# Patient Record
Sex: Female | Born: 2000
Health system: Southern US, Community
[De-identification: ages and names within clinical notes are randomized; demographics above are authoritative.]

## PROBLEM LIST (undated history)

## (undated) DIAGNOSIS — R51 Headache: Principal | ICD-10-CM

## (undated) DIAGNOSIS — R519 Headache, unspecified: Secondary | ICD-10-CM

## (undated) HISTORY — PX: COSMETIC SURGERY: SHX468

## (undated) HISTORY — DX: Headache, unspecified: R51.9

## (undated) HISTORY — DX: Headache: R51

---
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
## 2018-01-04 ENCOUNTER — Encounter: Payer: 59 | Admitting: Physical Therapy

## 2018-01-11 ENCOUNTER — Ambulatory Visit: Payer: 59 | Attending: Internal Medicine | Admitting: Physical Therapy

## 2018-01-19 ENCOUNTER — Encounter: Payer: Self-pay | Admitting: Internal Medicine

## 2018-01-19 ENCOUNTER — Ambulatory Visit: Payer: 59 | Admitting: Internal Medicine

## 2018-01-19 VITALS — BP 110/76 | HR 106 | Temp 98.3°F | Wt 134.0 lb

## 2018-01-19 DIAGNOSIS — R0602 Shortness of breath: Secondary | ICD-10-CM | POA: Diagnosis not present

## 2018-01-19 DIAGNOSIS — J45990 Exercise induced bronchospasm: Secondary | ICD-10-CM | POA: Diagnosis not present

## 2018-01-19 DIAGNOSIS — R0789 Other chest pain: Secondary | ICD-10-CM

## 2018-01-19 MED ORDER — ALBUTEROL SULFATE HFA 108 (90 BASE) MCG/ACT IN AERS: 2.0000 | INHALATION_SPRAY | Freq: Four times a day (QID) | RESPIRATORY_TRACT | 0 refills | Status: DC | PRN

## 2018-01-25 ENCOUNTER — Telehealth: Payer: Self-pay

## 2018-01-25 NOTE — Telephone Encounter (Signed)
Copied from CRM [phone]#[id number]. Topic: Inquiry >> Jan 25, 2018  4:05 PM Debroah LoopLander, Lumin L wrote: Reason for CRM: Would like first time ever HPC vaccine. Please call back to schedule as I'm not sure if it's on hand.

## 2018-01-26 ENCOUNTER — Encounter: Payer: Self-pay | Admitting: Internal Medicine

## 2018-01-26 NOTE — Telephone Encounter (Signed)
I spoke with pts mom (DPR signed) and pt needed appt after 12:30; pt scheduled on 02/01/18 at 2:30 for nurse visit.pts mom voiced understanding.

## 2018-01-26 NOTE — Patient Instructions (Signed)

## 2018-01-26 NOTE — Progress Notes (Signed)
Subjective:    Patient ID: Emma Gaines, female    DOB: Oct 28, 2001, 17 y.o.   MRN: [id number]  HPI  Pt presents to the clinic today with c/o intermittent chest tightness and shortness of breath with exertion. She noticed this about 6 months ago. She reports she exercises a lot, plays soccor, but sometimes if it is extremely hot or cold, she will have the chest tightness and shortness of breath. If she rests for 10-15 minutes, her symptoms resolve. She denies chest pain. She has tried a friend's Albuterol inhaler with good relief. She has never been told that she has asthma or reactive airway disease.   Review of Systems      Past Medical History:  Diagnosis Date  . Frequent headaches     Current Outpatient Medications  Medication Sig Dispense Refill  . buPROPion (WELLBUTRIN XL) 150 MG 24 hr tablet TAKE 1 TABLET BY MOUTH ONCE DAILY 30 tablet 2  . butalbital-acetaminophen-caffeine (FIORICET, ESGIC) 50-325-40 MG tablet Take 1-2 tablets by mouth every 6 (six) hours as needed for headache. 20 tablet 0  . hydrOXYzine (ATARAX/VISTARIL) 10 MG tablet Take 1 tablet (10 mg total) by mouth daily as needed. 30 tablet 0  . naproxen (NAPROSYN) 500 MG tablet Take 1 tablet (500 mg total) by mouth 2 (two) times daily with a meal. 60 tablet 2  . norgestimate-ethinyl estradiol (SPRINTEC 28) 0.25-35 MG-MCG tablet Take 1 tablet by mouth daily. 28 tablet 11  . ondansetron (ZOFRAN) 4 MG tablet Take 1 tablet (4 mg total) by mouth every 8 (eight) hours as needed. 20 tablet 0  . albuterol (PROVENTIL HFA;VENTOLIN HFA) 108 (90 Base) MCG/ACT inhaler Inhale 2 puffs into the lungs every 6 (six) hours as needed for wheezing or shortness of breath. 1 Inhaler 0   No current facility-administered medications for this visit.     Allergies  Allergen Reactions  . Other Anaphylaxis    CORN DOG    Family History  Problem Relation Age of Onset  . Lung cancer Maternal Grandmother   . Alcohol abuse Maternal  Grandfather   . Colon cancer Paternal Uncle 24    Social History   Socioeconomic History  . Marital status: Single    Spouse name: Not on file  . Number of children: Not on file  . Years of education: Not on file  . Highest education level: Not on file  Social Needs  . Financial resource strain: Not on file  . Food insecurity - worry: Not on file  . Food insecurity - inability: Not on file  . Transportation needs - medical: Not on file  . Transportation needs - non-medical: Not on file  Occupational History  . Not on file  Tobacco Use  . Smoking status: Never Smoker  . Smokeless tobacco: Never Used  Substance and Sexual Activity  . Alcohol use: No    Alcohol/week: 0.0 oz  . Drug use: No  . Sexual activity: Yes  Other Topics Concern  . Not on file  Social History Narrative  . Not on file     Constitutional: Denies fever, malaise, fatigue, headache or abrupt weight changes.  HEENT: Denies eye pain, eye redness, ear pain, ringing in the ears, wax buildup, runny nose, nasal congestion, bloody nose, or sore throat. Respiratory: Pt reports intermittent shortness of breath. Denies difficulty breathing, cough or sputum production.   Cardiovascular: Pt reports intermittent chest tightness. Denies chest pain, palpitations or swelling in the hands or feet.  No other specific complaints in a complete review of systems (except as listed in HPI above).  Objective:   Physical Exam   BP 110/76   Pulse (!) 106   Temp 98.3 F (36.8 C) (Oral)   Wt 134 lb (60.8 kg)   LMP 01/09/2018   SpO2 98%  Wt Readings from Last 3 Encounters:  01/19/18 134 lb (60.8 kg) (67 %, Z= 0.43)*  12/25/17 133 lb 1.9 oz (60.4 kg) (66 %, Z= 0.40)*  09/21/17 132 lb (59.9 kg) (65 %, Z= 0.38)*   * Growth percentiles are based on CDC (Girls, 2-20 Years) data.    General: Appears her stated age, well developed, well nourished in NAD. Cardiovascular: Normal rate and rhythm. S1,S2 noted.  No murmur, rubs  or gallops noted.  Pulmonary/Chest: Normal effort and positive vesicular breath sounds. No respiratory distress. No wheezes, rales or ronchi noted.         Assessment & Plan:   Exercise Induced Asthma:  eRx for Albuterol 30 minutes prior to exercise RX for Peak Flow Meter per mothers request  RTC as needed or if symptoms persist or worsen Yamira Papa, NP

## 2018-01-26 NOTE — Telephone Encounter (Signed)
Ok to schedule nurse visit for HPV vaccine

## 2018-02-01 ENCOUNTER — Ambulatory Visit (INDEPENDENT_AMBULATORY_CARE_PROVIDER_SITE_OTHER): Payer: 59

## 2018-02-01 DIAGNOSIS — Z23 Encounter for immunization: Secondary | ICD-10-CM | POA: Diagnosis not present

## 2018-04-03 ENCOUNTER — Other Ambulatory Visit: Payer: Self-pay

## 2018-04-03 ENCOUNTER — Ambulatory Visit (INDEPENDENT_AMBULATORY_CARE_PROVIDER_SITE_OTHER): Payer: 59

## 2018-04-03 ENCOUNTER — Encounter: Payer: Self-pay | Admitting: Emergency Medicine

## 2018-04-03 ENCOUNTER — Ambulatory Visit
Admission: EM | Admit: 2018-04-03 | Discharge: 2018-04-03 | Disposition: A | Discharge status: Home / Self Care | Payer: 59 | Attending: Family Medicine | Admitting: Family Medicine

## 2018-04-03 DIAGNOSIS — Y9366 Activity, soccer: Secondary | ICD-10-CM

## 2018-04-03 DIAGNOSIS — M79672 Pain in left foot: Secondary | ICD-10-CM

## 2018-04-03 MED ORDER — MELOXICAM 15 MG PO TABS: 15.0000 mg | ORAL_TABLET | Freq: Every day | ORAL | 0 refills | Status: DC | PRN

## 2018-04-03 NOTE — Discharge Instructions (Signed)
Rest, Ice, Elevation.  Medication as directed.  Take care  Dr. Adriana Simas

## 2018-04-03 NOTE — ED Provider Notes (Signed)
[facility]  CSN: [id number] Arrival date & time: 04/03/18  1926  History   Chief Complaint Chief Complaint  Patient presents with  . Foot Pain    left   HPI  17 year old female presents with a foot injury.  Patient reports that she was at soccer practice and was either stepped on or kicked in the left foot.  She reports significant pain to the dorsum of the left foot.  No reports of swelling.  No ankle pain.  No ankle swelling.  She reports increase in pain with plantar flexion.  No medications tried.  No other associated symptoms.  No other complaints.  Past Medical History:  Diagnosis Date  . Frequent headaches    Patient Active Problem List   Diagnosis Date Noted  . Anxiety 09/21/2017  . Frequent headaches 05/19/2015   Past Surgical History:  Procedure Laterality Date  . COSMETIC SURGERY     upper lip   OB History   None    Home Medications    Prior to Admission medications   Medication Sig Start Date End Date Taking? Authorizing Provider  buPROPion (WELLBUTRIN XL) 150 MG 24 hr tablet TAKE 1 TABLET BY MOUTH ONCE DAILY 12/15/17  Yes Baity, Salvadore Oxford, NP  norgestimate-ethinyl estradiol (SPRINTEC 28) 0.25-35 MG-MCG tablet Take 1 tablet by mouth daily. 09/21/17  Yes Baity, Salvadore Oxford, NP  albuterol (PROVENTIL HFA;VENTOLIN HFA) 108 (90 Base) MCG/ACT inhaler Inhale 2 puffs into the lungs every 6 (six) hours as needed for wheezing or shortness of breath. 01/19/18   Lorre Munroe, NP  butalbital-acetaminophen-caffeine (FIORICET, ESGIC) [phone] MG tablet Take 1-2 tablets by mouth every 6 (six) hours as needed for headache. 12/25/17 12/25/18  Lorre Munroe, NP  meloxicam (MOBIC) 15 MG tablet Take 1 tablet (15 mg total) by mouth daily as needed. 04/03/18   Tommie Sams, DO  ondansetron (ZOFRAN) 4 MG tablet Take 1 tablet (4 mg total) by mouth every 8 (eight) hours as needed. 12/25/17   Lorre Munroe, NP   Family History Family History  Problem Relation Age of Onset   . Lung cancer Maternal Grandmother   . Alcohol abuse Maternal Grandfather   . Colon cancer Paternal Uncle 53   Social History Social History   Tobacco Use  . Smoking status: Never Smoker  . Smokeless tobacco: Never Used  Substance Use Topics  . Alcohol use: No    Alcohol/week: 0.0 oz  . Drug use: No   Allergies   Other  Review of Systems Review of Systems  Constitutional: Negative.   Musculoskeletal:       Left foot pain.   Physical Exam Triage Vital Signs ED Triage Vitals  Enc Vitals Group     BP 04/03/18 1938 113/76     Pulse Rate 04/03/18 1938 78     Resp 04/03/18 1938 14     Temp 04/03/18 1938 98.3 F (36.8 C)     Temp Source 04/03/18 1938 Oral     SpO2 04/03/18 1938 100 %     Weight 04/03/18 1936 130 lb (59 kg)     Height --      Head Circumference --      Peak Flow --      Pain Score 04/03/18 1936 6     Pain Loc --      Pain Edu? --      Excl. in GC? --    Updated Vital Signs BP 113/76 (BP Location: Right Arm)  Pulse 78   Temp 98.3 F (36.8 C) (Oral)   Resp 14   Wt 130 lb (59 kg)   LMP 04/03/2018 (Exact Date)   SpO2 100%   Physical Exam  Constitutional: She is oriented to person, place, and time. She appears well-developed. No distress.  HENT:  Head: Normocephalic and atraumatic.  Pulmonary/Chest: Effort normal. No respiratory distress.  Musculoskeletal:  Left foot: Tenderness on the dorsum of the foot.  No bruising or swelling.  Left ankle: No swelling.  No tenderness.  Neurological: She is alert and oriented to person, place, and time.  Psychiatric: She has a normal mood and affect. Her behavior is normal.  Nursing note and vitals reviewed.  UC Treatments / Results  Labs (all labs ordered are listed, but only abnormal results are displayed) Labs Reviewed - No data to display  EKG None  Radiology Dg Foot Complete Left  Result Date: 04/03/2018 CLINICAL DATA:  Soccer injury with pain EXAM: LEFT FOOT - COMPLETE 3+ VIEW COMPARISON:   10/23/2015 FINDINGS: There is no evidence of fracture or dislocation. There is no evidence of arthropathy or other focal bone abnormality. Soft tissues are unremarkable. Ossicle adjacent to the navicular bone. IMPRESSION: Negative. Electronically Signed   By: Jasmine Pang M.D.   On: 04/03/2018 20:15    Procedures Procedures (including critical care time)  Medications Ordered in UC Medications - No data to display  Initial Impression / Assessment and Plan / UC Course  I have reviewed the triage vital signs and the nursing notes.  Pertinent labs & imaging results that were available during my care of the patient were reviewed by me and considered in my medical decision making (see chart for details).    17 year old female presents with a foot injury.  X-ray negative.  Treating with meloxicam.  Supportive care.  Final Clinical Impressions(s) / UC Diagnoses   Final diagnoses:  Left foot pain   ED Prescriptions    Medication Sig Dispense Auth. Provider   meloxicam (MOBIC) 15 MG tablet Take 1 tablet (15 mg total) by mouth daily as needed. 30 tablet Tommie Sams, DO     Controlled Substance Prescriptions [place] Controlled Substance Registry consulted? Not Applicable   Tommie Sams, DO 04/03/18 2046

## 2018-04-03 NOTE — ED Triage Notes (Signed)
Patient states that another player stepped on the top of her left foot today at soccer practice.

## 2018-04-05 ENCOUNTER — Ambulatory Visit: Payer: 59

## 2018-04-11 ENCOUNTER — Ambulatory Visit (INDEPENDENT_AMBULATORY_CARE_PROVIDER_SITE_OTHER): Payer: 59

## 2018-04-11 DIAGNOSIS — Z23 Encounter for immunization: Secondary | ICD-10-CM | POA: Diagnosis not present

## 2018-06-04 ENCOUNTER — Ambulatory Visit: Payer: 59 | Admitting: Family Medicine

## 2018-06-04 ENCOUNTER — Encounter: Payer: Self-pay | Admitting: Family Medicine

## 2018-06-04 VITALS — BP 102/64 | HR 97 | Temp 98.1°F | Ht 64.05 in | Wt 130.0 lb

## 2018-06-04 DIAGNOSIS — B9789 Other viral agents as the cause of diseases classified elsewhere: Secondary | ICD-10-CM | POA: Diagnosis not present

## 2018-06-04 DIAGNOSIS — J069 Acute upper respiratory infection, unspecified: Secondary | ICD-10-CM

## 2018-06-04 MED ORDER — AMOXICILLIN 875 MG PO TABS: 875.0000 mg | ORAL_TABLET | Freq: Two times a day (BID) | ORAL | 0 refills | Status: DC

## 2018-06-04 NOTE — Patient Instructions (Signed)
Good to see you today  If you are not better in 3-4 days, you can start antibiotic  For nasal congestion you can use Afrin nasal spray for 3 days max, Sudafed, saline nasal spray (generic is fine for all). For cough you can try Delsym. Drink enough fluids to make your urine light yellow. For fever/chill/muscle aches you can take over the counter acetaminophen or ibuprofen.  Please come back in if you are not better in 5-7 days or if you develop wheezing, shortness of breath or persistent vomiting.

## 2018-06-04 NOTE — Progress Notes (Signed)
Subjective:    Patient ID: Emma Gaines, female    DOB: Nov 07, 2001, 17 y.o.   MRN: [id number]  HPI This is an 17 yo female, accompanied by her mother who is also being seen for similar symptoms, who presents today with 8 days of sore throat.  Started with nasal congestion, sore throat, left ear pain. Alternating congestion, runny nose. Cough which is dry. No wheezing, no SOB. Has taken ibuprofen, sudafed, Afrin. Some improvement of symptoms.    Past Medical History:  Diagnosis Date  . Frequent headaches    Past Surgical History:  Procedure Laterality Date  . COSMETIC SURGERY     upper lip   Family History  Problem Relation Age of Onset  . Lung cancer Maternal Grandmother   . Alcohol abuse Maternal Grandfather   . Colon cancer Paternal Uncle [id number]   Social History   Tobacco Use  . Smoking status: Never Smoker  . Smokeless tobacco: Never Used  Substance Use Topics  . Alcohol use: No    Alcohol/week: 0.0 oz  . Drug use: No      Review of Systems Per HPI    Objective:   Physical Exam  Constitutional: She is oriented to person, place, and time. She appears well-developed and well-nourished.  Non-toxic appearance. She does not appear ill. No distress.  HENT:  Head: Normocephalic and atraumatic.  Right Ear: Ear canal normal. Tympanic membrane is scarred.  Left Ear: Tympanic membrane and ear canal normal.  Mouth/Throat: Uvula is midline and oropharynx is clear and moist. No oropharyngeal exudate, posterior oropharyngeal edema or posterior oropharyngeal erythema.  Neck: Normal range of motion. Neck supple.  Cardiovascular: Normal rate, regular rhythm and normal heart sounds.  Pulmonary/Chest: Effort normal and breath sounds normal.  Neurological: She is alert and oriented to person, place, and time.  Skin: Skin is warm and dry.  Psychiatric: She has a normal mood and affect. Her behavior is normal. Judgment and thought content normal.      BP 102/64 (BP  Location: Right Arm, Patient Position: Sitting, Cuff Size: Normal)   Pulse 97   Temp 98.1 F (36.7 C) (Oral)   Ht 5' 4.05" (1.627 m)   Wt 130 lb (59 kg)   LMP 05/28/2018   SpO2 98%   BMI 22.28 kg/m  Wt Readings from Last 3 Encounters:  06/04/18 130 lb (59 kg) (59 %, Z= 0.22)*  04/03/18 130 lb (59 kg) (59 %, Z= 0.24)*  01/19/18 134 lb (60.8 kg) (67 %, Z= 0.43)*   * Growth percentiles are based on CDC (Girls, 2-20 Years) data.       Assessment & Plan:  1. Viral URI with cough - Provided written and verbal information regarding diagnosis and treatment. -  Patient Instructions  Good to see you today  If you are not better in 3-4 days, you can start antibiotic  For nasal congestion you can use Afrin nasal spray for 3 days max, Sudafed, saline nasal spray (generic is fine for all). For cough you can try Delsym. Drink enough fluids to make your urine light yellow. For fever/chill/muscle aches you can take over the counter acetaminophen or ibuprofen.  Please come back in if you are not better in 5-7 days or if you develop wheezing, shortness of breath or persistent vomiting.    - wait and see antibiotic provided- Amoxicillin 875 mg po BID #14   Olean Reeeborah Eller Sweis, FNP-BC  [facility], [facility]  06/04/2018 2:43 PM

## 2018-07-31 ENCOUNTER — Telehealth: Payer: Self-pay

## 2018-07-31 NOTE — Telephone Encounter (Signed)
She still has to be seen

## 2018-07-31 NOTE — Telephone Encounter (Signed)
I spoke with Emma Gaines (DPR signed); pt in college this morning; offered 2 appts for pt to be seen today. pts mom said pt is taking AZO and does not want to come in for appt. Belinda will ck with pt later today for update and cb if appt needed. Belinda wants R Baity NP to know that on urine ck pt has +leukocytes but negative nitrites.

## 2018-07-31 NOTE — Telephone Encounter (Signed)
PLEASE NOTE: All timestamps contained within this report are represented as [place] Standard Time. CONFIDENTIALTY NOTICE: This fax transmission is intended only for the addressee. It contains information that is legally privileged, confidential or otherwise protected from use or disclosure. If you are not the intended recipient, you are strictly prohibited from reviewing, disclosing, copying using or disseminating any of this information or taking any action in reliance on or regarding this information. If you have received this fax in error, please notify us immediately by telephone so that we can arrange for its return to us. Phone: [phone], Toll-Free: [phone], Fax: [phone] Page: 1 of 1 Call Id: [id number] [facility] - Client Nonclinical Telephone Record [facility] Client [facility] - Client [facility] - Night Physician Nicki ReaperBaity, Regina - NP Contact Type Call Who Is Calling Patient / Member / Family / Caregiver Caller Name Carolee RotaBelenda Membreno Caller Phone Number [phone] Patient Name Emma SearsHayley Gaines Patient DOB 06/25/2001 Call Type Message Only Information Provided Reason for Call Request to Schedule Office Appointment Initial Comment Caller states she wants to make an apt for her dtr. Additional Comment Caller declined triage. Call Closed By: Virgilio FreesLatrice Booker Transaction Date/Time: 07/30/2018 7:36:14 PM (ET)

## 2018-08-02 ENCOUNTER — Encounter: Payer: Self-pay | Admitting: Internal Medicine

## 2018-08-02 ENCOUNTER — Ambulatory Visit: Payer: 59 | Admitting: Internal Medicine

## 2018-08-02 ENCOUNTER — Ambulatory Visit: Payer: 59

## 2018-08-02 VITALS — BP 106/78 | HR 79 | Temp 98.7°F | Wt 134.0 lb

## 2018-08-02 DIAGNOSIS — R310 Gross hematuria: Secondary | ICD-10-CM | POA: Diagnosis not present

## 2018-08-02 DIAGNOSIS — Z23 Encounter for immunization: Secondary | ICD-10-CM | POA: Diagnosis not present

## 2018-08-02 DIAGNOSIS — R300 Dysuria: Secondary | ICD-10-CM

## 2018-08-02 DIAGNOSIS — R350 Frequency of micturition: Secondary | ICD-10-CM

## 2018-08-02 LAB — POC URINALSYSI DIPSTICK (AUTOMATED)
Bilirubin, UA: NEGATIVE
Blood, UA: NEGATIVE
Glucose, UA: NEGATIVE
Ketones, UA: NEGATIVE
Leukocytes, UA: NEGATIVE
Nitrite, UA: NEGATIVE
Protein, UA: NEGATIVE
Spec Grav, UA: 1.015 (ref 1.010–1.025)
Urobilinogen, UA: 0.2 E.U./dL
pH, UA: 7 (ref 5.0–8.0)

## 2018-08-02 NOTE — Progress Notes (Signed)
HPI  Pt presents to the clinic today with c/o urinary frequency, dysuria and blood in her urine. This started 3-4 days ago. She denies fever, chills, nausea or low back pain. She denies vaginal complaints. She has tried AZO with minimal relief. She is sexually active.   Review of Systems  Past Medical History:  Diagnosis Date  . Frequent headaches     Family History  Problem Relation Age of Onset  . Lung cancer Maternal Grandmother   . Alcohol abuse Maternal Grandfather   . Colon cancer Paternal Uncle [id number]    Social History   Socioeconomic History  . Marital status: Single    Spouse name: Not on file  . Number of children: Not on file  . Years of education: Not on file  . Highest education level: Not on file  Occupational History  . Not on file  Social Needs  . Financial resource strain: Not on file  . Food insecurity:    Worry: Not on file    Inability: Not on file  . Transportation needs:    Medical: Not on file    Non-medical: Not on file  Tobacco Use  . Smoking status: Never Smoker  . Smokeless tobacco: Never Used  Substance and Sexual Activity  . Alcohol use: No    Alcohol/week: 0.0 standard drinks  . Drug use: No  . Sexual activity: Yes  Lifestyle  . Physical activity:    Days per week: Not on file    Minutes per session: Not on file  . Stress: Not on file  Relationships  . Social connections:    Talks on phone: Not on file    Gets together: Not on file    Attends religious service: Not on file    Active member of club or organization: Not on file    Attends meetings of clubs or organizations: Not on file    Relationship status: Not on file  . Intimate partner violence:    Fear of current or ex partner: Not on file    Emotionally abused: Not on file    Physically abused: Not on file    Forced sexual activity: Not on file  Other Topics Concern  . Not on file  Social History Narrative  . Not on file    Allergies  Allergen Reactions  . Other  Anaphylaxis    CORN DOG     Constitutional: Denies fever, malaise, fatigue, headache or abrupt weight changes.   GU: Pt reports hematuria, frequency and pain with urination. Denies urgency, burning sensation,  odor or discharge.   No other specific complaints in a complete review of systems (except as listed in HPI above).    Objective:   Physical Exam BP 106/78   Pulse 79   Temp 98.7 F (37.1 C) (Oral)   Wt 134 lb (60.8 kg)   LMP 07/23/2018   SpO2 98%   BMI 22.97 kg/m   Wt Readings from Last 3 Encounters:  08/02/18 134 lb (60.8 kg) (64 %, Z= 0.37)*  06/04/18 130 lb (59 kg) (59 %, Z= 0.22)*  04/03/18 130 lb (59 kg) (59 %, Z= 0.24)*   * Growth percentiles are based on CDC (Girls, 2-20 Years) data.    General: Appears her stated age, well developed, well nourished in NAD. Abdomen: Soft and nontender. Normal bowel sounds. No distention or masses noted. No CVA tenderness.        Assessment & Plan:   Hematuria,  Frequency, Dysuria:  Urinalysis: normal Will send urine culture OK to take AZO OTC Drink plenty of fluids  RTC as needed or if symptoms persist. Nicki Reaper, NP

## 2018-08-02 NOTE — Patient Instructions (Signed)

## 2018-08-02 NOTE — Addendum Note (Signed)
Addended by: Roena MaladyEVONTENNO, MELANIE Y on: 08/02/2018 04:51 PM   Modules accepted: Orders

## 2018-08-03 LAB — URINE CULTURE
MICRO NUMBER:: 91035840
RESULT: NO GROWTH
SPECIMEN QUALITY:: ADEQUATE

## 2018-08-21 ENCOUNTER — Other Ambulatory Visit: Payer: Self-pay | Admitting: Internal Medicine

## 2018-08-22 IMAGING — CR DG FOOT COMPLETE 3+V*L*
3 series · 3 of 3 positions shown · non-contrast
Comparison: 10/23/2015

CLINICAL DATA: Soccer injury with pain

EXAM:
LEFT FOOT - COMPLETE 3+ VIEW

[foot ap]
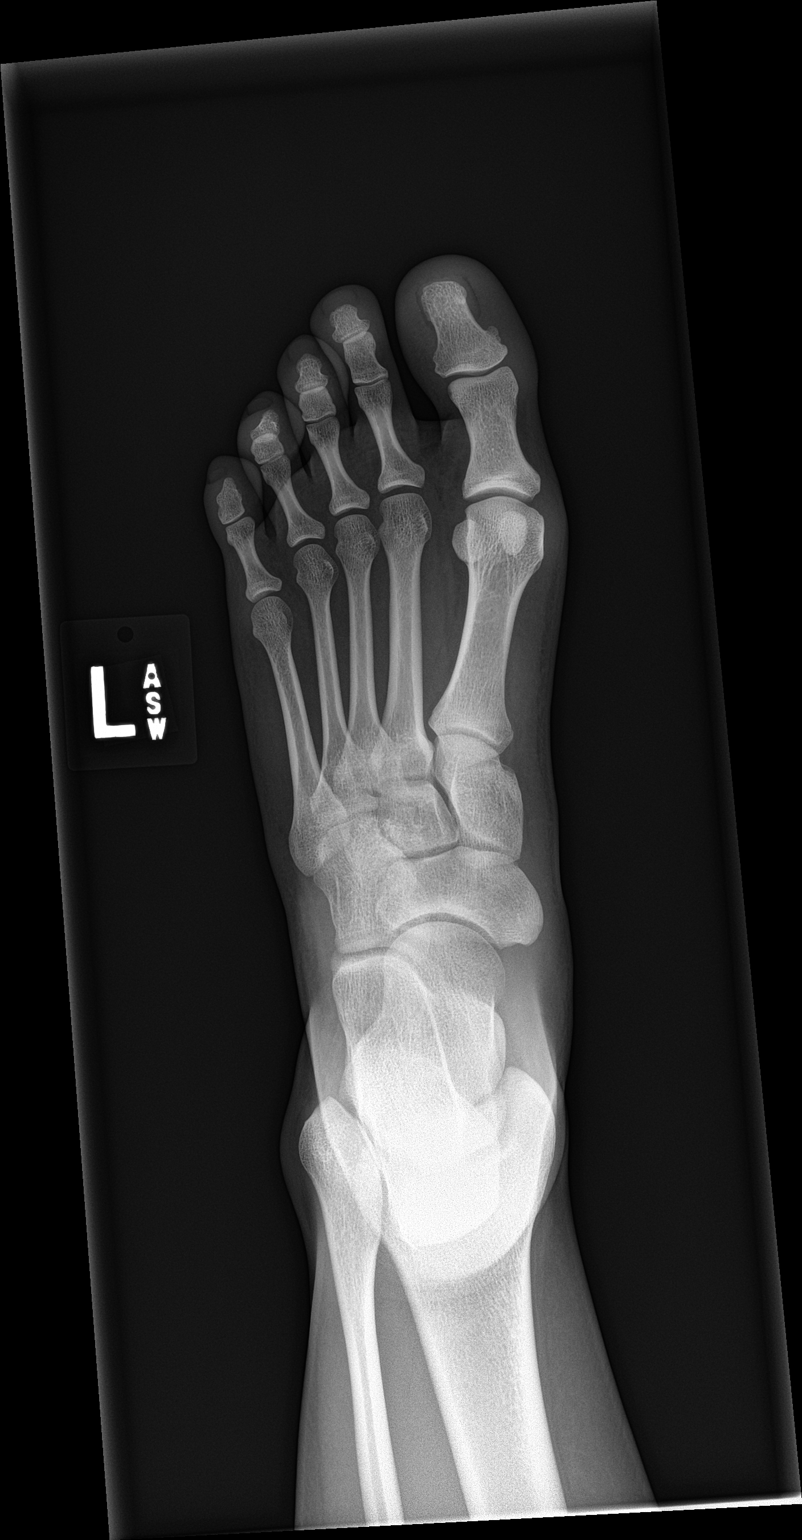

[foot obl]
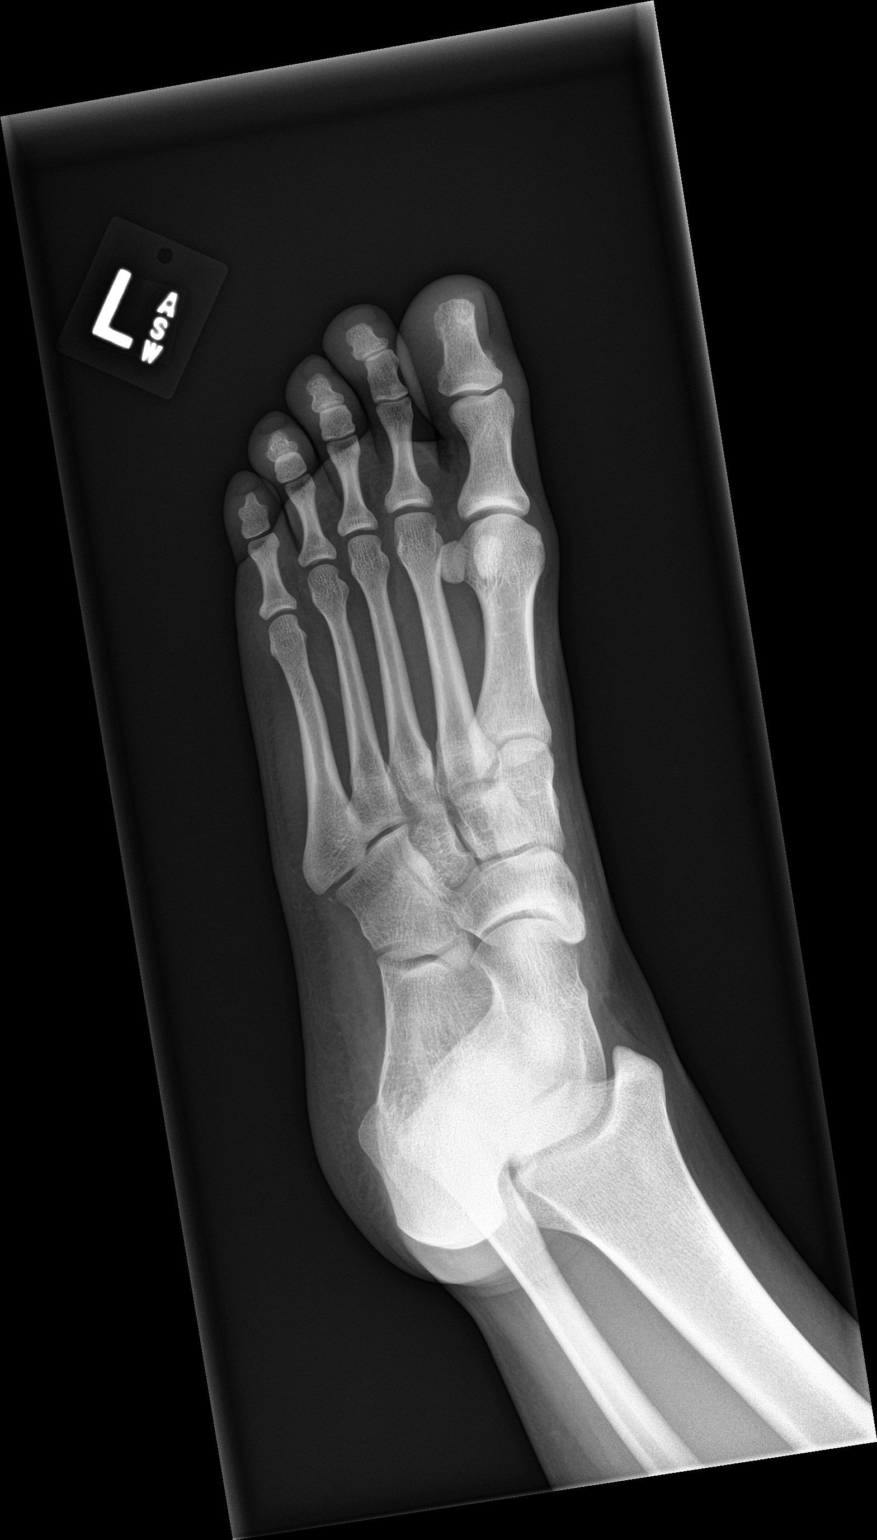

[foot lat]
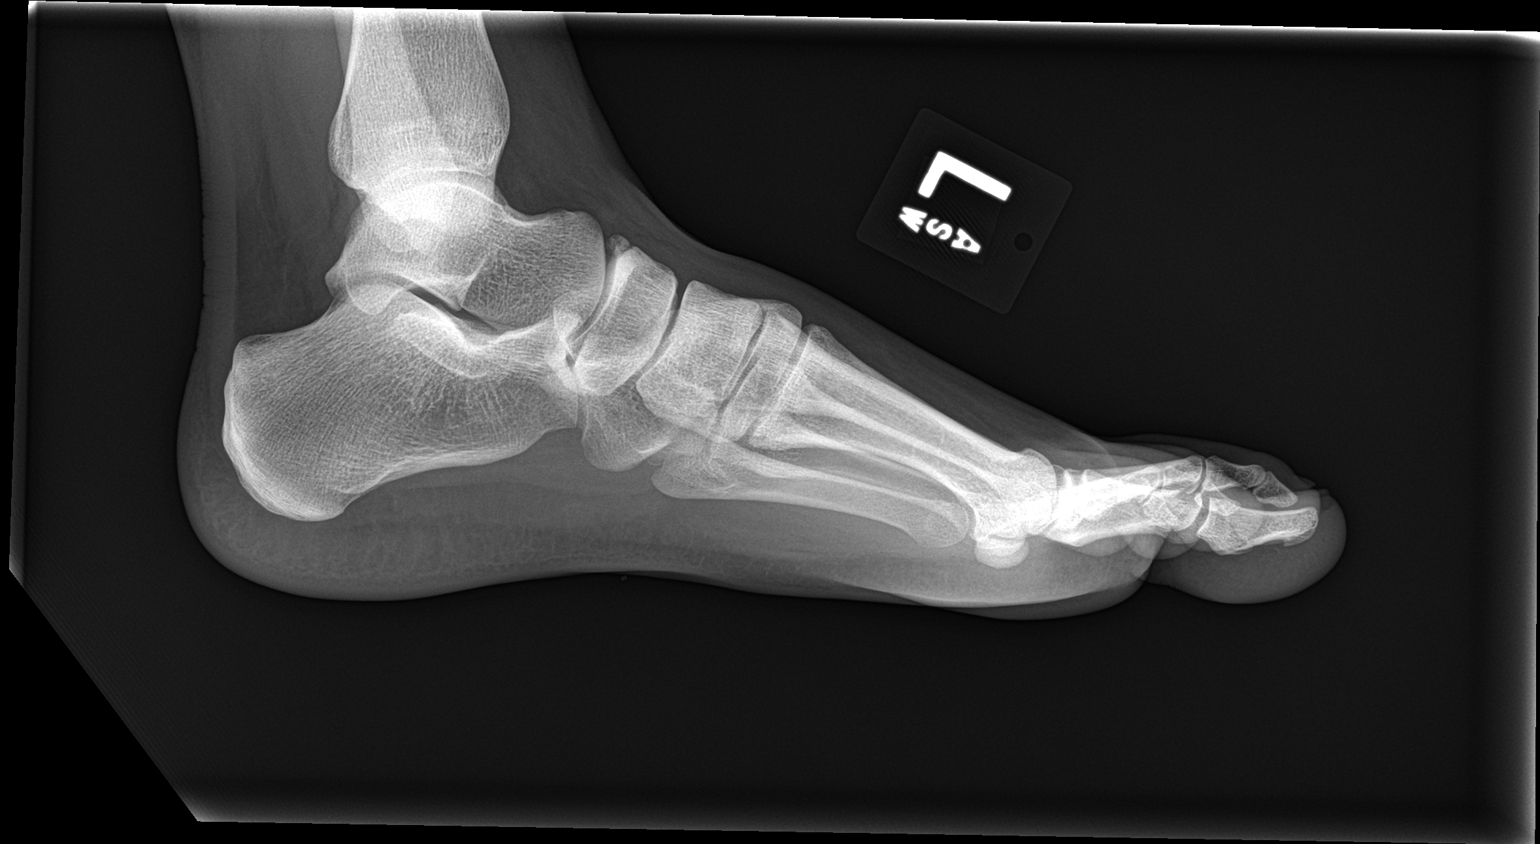

[3 of 3 positions shown; findings below may reference images not displayed]

FINDINGS: There is no evidence of fracture or dislocation. There is no
evidence of arthropathy or other focal bone abnormality. Soft
tissues are unremarkable. [place] adjacent to the navicular bone.
IMPRESSION: Negative.

## 2018-08-23 ENCOUNTER — Telehealth: Payer: Self-pay | Admitting: Internal Medicine

## 2018-08-23 MED ORDER — NORGESTIMATE-ETH ESTRADIOL 0.25-35 MG-MCG PO TABS: 1.0000 | ORAL_TABLET | Freq: Every day | ORAL | 0 refills | Status: DC

## 2018-08-23 NOTE — Telephone Encounter (Signed)
Mom stated pt need 2 more months of birth control pills because her appt isn't until 11.6.19.

## 2018-08-23 NOTE — Telephone Encounter (Signed)
Another month Rx sent to pharmacy

## 2018-09-07 ENCOUNTER — Other Ambulatory Visit: Payer: Self-pay | Admitting: Internal Medicine

## 2018-09-07 MED ORDER — NORGESTIMATE-ETH ESTRADIOL 0.25-35 MG-MCG PO TABS: 1.0000 | ORAL_TABLET | Freq: Every day | ORAL | 1 refills | Status: DC

## 2018-09-07 NOTE — Telephone Encounter (Signed)
Copied from CRM [id number]. Topic: Quick Communication - See Telephone Encounter >> Sep 07, 2018  7:36 AM Mare Loan F wrote: Pt is needing a refill on norgestimate-ethinyl   [facility]  [address] [phone]  [facility] num[facility]br [phone]

## 2018-09-07 NOTE — Telephone Encounter (Signed)
Request refill on Norgestimate-Ethinyl Estradiol; [facility] 08/23/18; #28; no refill  Pt. has annual physical exam on 10/10/18 with Nicki Reaper.  Refill above Rx; # 28 with one refill to get through until CPE.

## 2018-10-10 ENCOUNTER — Ambulatory Visit (INDEPENDENT_AMBULATORY_CARE_PROVIDER_SITE_OTHER): Payer: PRIVATE HEALTH INSURANCE | Admitting: Internal Medicine

## 2018-10-10 ENCOUNTER — Encounter: Payer: Self-pay | Admitting: Internal Medicine

## 2018-10-10 VITALS — BP 106/70 | HR 81 | Temp 98.5°F | Ht 64.0 in | Wt 137.0 lb

## 2018-10-10 DIAGNOSIS — Z0000 Encounter for general adult medical examination without abnormal findings: Secondary | ICD-10-CM | POA: Diagnosis not present

## 2018-10-10 NOTE — Progress Notes (Signed)
Subjective:    Patient ID: Emma Gaines, female    DOB: Dec 11, 2000, 17 y.o.   MRN: [id number]  HPI  Pt presents to the clinic today for her annual exam.  Frequent Headaches: Occur with increased stress. She takes Naproxen, Zofran, and Fioricet as needed.  Flu: never Tetanus: 2012 Dentist: biannually  Diet: She does eat meat. She consumes some fruits and veggies. She does eat fried foods. She drinks mostly water. Exercise: None  Review of Systems      Past Medical History:  Diagnosis Date  . Frequent headaches     Current Outpatient Medications  Medication Sig Dispense Refill  . albuterol (PROVENTIL HFA;VENTOLIN HFA) 108 (90 Base) MCG/ACT inhaler Inhale 2 puffs into the lungs every 6 (six) hours as needed for wheezing or shortness of breath. 1 Inhaler 0  . buPROPion (WELLBUTRIN XL) 150 MG 24 hr tablet TAKE 1 TABLET BY MOUTH ONCE DAILY 30 tablet 2  . butalbital-acetaminophen-caffeine (FIORICET, ESGIC) 50-325-40 MG tablet Take 1-2 tablets by mouth every 6 (six) hours as needed for headache. 20 tablet 0  . meloxicam (MOBIC) 15 MG tablet Take 1 tablet (15 mg total) by mouth daily as needed. 30 tablet 0  . naproxen (NAPROSYN) 500 MG tablet Take by mouth.    . norgestimate-ethinyl estradiol (SPRINTEC 28) 0.25-35 MG-MCG tablet Take 1 tablet by mouth daily. 28 tablet 1  . ondansetron (ZOFRAN) 4 MG tablet Take 1 tablet (4 mg total) by mouth every 8 (eight) hours as needed. 20 tablet 0   No current facility-administered medications for this visit.     Allergies  Allergen Reactions  . Other Anaphylaxis    CORN DOG    Family History  Problem Relation Age of Onset  . Lung cancer Maternal Grandmother   . Alcohol abuse Maternal Grandfather   . Colon cancer Paternal Uncle 15    Social History   Socioeconomic History  . Marital status: Single    Spouse name: Not on file  . Number of children: Not on file  . Years of education: Not on file  . Highest education  level: Not on file  Occupational History  . Not on file  Social Needs  . Financial resource strain: Not on file  . Food insecurity:    Worry: Not on file    Inability: Not on file  . Transportation needs:    Medical: Not on file    Non-medical: Not on file  Tobacco Use  . Smoking status: Never Smoker  . Smokeless tobacco: Never Used  Substance and Sexual Activity  . Alcohol use: No    Alcohol/week: 0.0 standard drinks  . Drug use: No  . Sexual activity: Yes  Lifestyle  . Physical activity:    Days per week: Not on file    Minutes per session: Not on file  . Stress: Not on file  Relationships  . Social connections:    Talks on phone: Not on file    Gets together: Not on file    Attends religious service: Not on file    Active member of club or organization: Not on file    Attends meetings of clubs or organizations: Not on file    Relationship status: Not on file  . Intimate partner violence:    Fear of current or ex partner: Not on file    Emotionally abused: Not on file    Physically abused: Not on file    Forced sexual activity: Not on file  Other Topics Concern  . Not on file  Social History Narrative  . Not on file     Constitutional: Pt reports frequent headaches. Denies fever, malaise, fatigue, or abrupt weight changes.  HEENT: Denies eye pain, eye redness, ear pain, ringing in the ears, wax buildup, runny nose, nasal congestion, bloody nose, or sore throat. Respiratory: Denies difficulty breathing, shortness of breath, cough or sputum production.   Cardiovascular: Denies chest pain, chest tightness, palpitations or swelling in the hands or feet.  Gastrointestinal: Denies abdominal pain, bloating, constipation, diarrhea or blood in the stool.  GU: Pt reports menstrual cramping. Denies urgency, frequency, pain with urination, burning sensation, blood in urine, odor or discharge. Musculoskeletal: Denies decrease in range of motion, difficulty with gait, muscle  pain or joint pain and swelling.  Skin: Denies redness, rashes, lesions or ulcercations.  Neurological: Denies dizziness, difficulty with memory, difficulty with speech or problems with balance and coordination.  Psych: Denies anxiety, depression, SI/HI.  No other specific complaints in a complete review of systems (except as listed in HPI above).  Objective:   Physical Exam  BP 106/70   Pulse 81   Temp 98.5 F (36.9 C) (Oral)   Ht 5\' 4"  (1.626 m)   Wt 137 lb (62.1 kg)   LMP 09/18/2018   SpO2 99%   BMI 23.52 kg/m  Wt Readings from Last 3 Encounters:  10/10/18 137 lb (62.1 kg) (68 %, Z= 0.47)*  08/02/18 134 lb (60.8 kg) (64 %, Z= 0.37)*  06/04/18 130 lb (59 kg) (59 %, Z= 0.22)*   * Growth percentiles are based on CDC (Girls, 2-20 Years) data.    General: Appears her stated age, well developed, well nourished in NAD. Skin: Warm, dry and intact.  HEENT: Head: normal shape and size; Eyes: sclera white, no icterus, conjunctiva pink, PERRLA and EOMs intact; Ears: Tm's gray and intact, normal light reflex;  Throat/Mouth: Teeth present, mucosa pink and moist, no exudate, lesions or ulcerations noted.  Neck:  Neck supple, trachea midline. No masses, lumps or thyromegaly present.  Cardiovascular: Normal rate and rhythm. S1,S2 noted.  No murmur, rubs or gallops noted. No JVD or BLE edema.  Pulmonary/Chest: Normal effort and positive vesicular breath sounds. No respiratory distress. No wheezes, rales or ronchi noted.  Abdomen: Soft and nontender. Normal bowel sounds. No distention or masses noted. Liver, spleen and kidneys non palpable. Musculoskeletal: Strength 5/5 BUE/BLE. No difficulty with gait.  Neurological: Alert and oriented. Cranial nerves II-XII grossly intact. Coordination normal.  Psychiatric: Mood and affect normal. Behavior is normal. Judgment and thought content normal.       Assessment & Plan:   Preventative Health Maintenance:  She declines flu shot today Tetanus  UTD Encouraged her to consume a balanced diet and exercise regimen Encouraged her to see a dentist annually Will check CBC, CMET, and Lipid profile today  RTC in 1 year, sooner if needed Nicki Reaper, NP

## 2018-10-10 NOTE — Patient Instructions (Signed)

## 2018-10-11 LAB — COMPREHENSIVE METABOLIC PANEL
ALT: 13 U/L (ref 0–35)
AST: 17 U/L (ref 0–37)
Albumin: 4.1 g/dL (ref 3.5–5.2)
Alkaline Phosphatase: 39 U/L — ABNORMAL LOW (ref 47–119)
BILIRUBIN TOTAL: 0.3 mg/dL (ref 0.3–1.2)
BUN: 11 mg/dL (ref 6–23)
CHLORIDE: 106 meq/L (ref 96–112)
CO2: 28 meq/L (ref 19–32)
CREATININE: 0.71 mg/dL (ref 0.40–1.20)
Calcium: 9 mg/dL (ref 8.4–10.5)
GFR: 112.73 mL/min (ref >60.00)
GLUCOSE: 91 mg/dL (ref 70–99)
Potassium: 4.3 mEq/L (ref 3.5–5.1)
SODIUM: 140 meq/L (ref 135–145)
Total Protein: 6.6 g/dL (ref 6.0–8.3)

## 2018-10-11 LAB — CBC
HCT: 35.3 % — ABNORMAL LOW (ref 36.0–49.0)
Hemoglobin: 12.1 g/dL (ref 12.0–16.0)
MCHC: 34.3 g/dL (ref 31.0–37.0)
MCV: 89.4 fl (ref 78.0–98.0)
PLATELETS: 277 10*3/uL (ref 150.0–575.0)
RBC: 3.95 Mil/uL (ref 3.80–5.70)
RDW: 12.7 % (ref 11.4–15.5)
WBC: 7.7 10*3/uL (ref 4.5–13.5)

## 2018-10-11 LAB — LIPID PANEL
CHOL/HDL RATIO: 3
Cholesterol: 149 mg/dL (ref 0–200)
HDL: 55.7 mg/dL (ref >39.00)
LDL CALC: 56 mg/dL (ref 0–99)
NONHDL: 93.17
Triglycerides: 187 mg/dL — ABNORMAL HIGH (ref 0.0–149.0)
VLDL: 37.4 mg/dL (ref 0.0–40.0)

## 2019-01-03 ENCOUNTER — Ambulatory Visit: Payer: PRIVATE HEALTH INSURANCE | Admitting: Internal Medicine

## 2019-01-03 VITALS — BP 104/66 | HR 88 | Temp 98.2°F | Wt 135.0 lb

## 2019-01-03 DIAGNOSIS — R110 Nausea: Secondary | ICD-10-CM | POA: Diagnosis not present

## 2019-01-03 DIAGNOSIS — R5383 Other fatigue: Secondary | ICD-10-CM

## 2019-01-03 DIAGNOSIS — R590 Localized enlarged lymph nodes: Secondary | ICD-10-CM

## 2019-01-03 DIAGNOSIS — R51 Headache: Secondary | ICD-10-CM

## 2019-01-03 DIAGNOSIS — R519 Headache, unspecified: Secondary | ICD-10-CM

## 2019-01-03 NOTE — Progress Notes (Signed)
Subjective:    Patient ID: Emma Gaines, female    DOB: Jan 13, 2001, 18 y.o.   MRN: [id number]  HPI  Patient presents to clinic today with complaint of headaches, fatigue and intermittent nausea.  She reports this started 2 to 3 months ago.  The headaches are located in the top of her head.  She describes the pain as a dull ache.  She denies associated visual changes, dizziness, sensitivity to light or sound.  She does have some intermittent nausea but denies vomiting, diarrhea, constipation or abdominal pain.  She reports she is sleeping about 8 hours per night.  She does not feel rested when she wakes up.  She denies recent changes in diet or medications.  Her mother is concerned about pregnancy.  She has taken a negative urine pregnancy test at home.  She is sexually active with one partner.  She is on oral contraceptive pills.  She does report increase in stress, but denies anxiety or depression.  Review of Systems      Past Medical History:  Diagnosis Date  . Frequent headaches     Current Outpatient Medications  Medication Sig Dispense Refill  . albuterol (PROVENTIL HFA;VENTOLIN HFA) 108 (90 Base) MCG/ACT inhaler Inhale 2 puffs into the lungs every 6 (six) hours as needed for wheezing or shortness of breath. 1 Inhaler 0  . naproxen (NAPROSYN) 500 MG tablet Take by mouth.    . norgestimate-ethinyl estradiol (SPRINTEC 28) 0.25-35 MG-MCG tablet Take 1 tablet by mouth daily. 28 tablet 1  . ondansetron (ZOFRAN) 4 MG tablet Take 1 tablet (4 mg total) by mouth every 8 (eight) hours as needed. 20 tablet 0   No current facility-administered medications for this visit.     Allergies  Allergen Reactions  . Other Anaphylaxis    CORN DOG    Family History  Problem Relation Age of Onset  . Lung cancer Maternal Grandmother   . Alcohol abuse Maternal Grandfather   . Colon cancer Paternal Uncle 24    Social History   Socioeconomic History  . Marital status: Single   Spouse name: Not on file  . Number of children: Not on file  . Years of education: Not on file  . Highest education level: Not on file  Occupational History  . Not on file  Social Needs  . Financial resource strain: Not on file  . Food insecurity:    Worry: Not on file    Inability: Not on file  . Transportation needs:    Medical: Not on file    Non-medical: Not on file  Tobacco Use  . Smoking status: Never Smoker  . Smokeless tobacco: Never Used  Substance and Sexual Activity  . Alcohol use: No    Alcohol/week: 0.0 standard drinks  . Drug use: No  . Sexual activity: Yes  Lifestyle  . Physical activity:    Days per week: Not on file    Minutes per session: Not on file  . Stress: Not on file  Relationships  . Social connections:    Talks on phone: Not on file    Gets together: Not on file    Attends religious service: Not on file    Active member of club or organization: Not on file    Attends meetings of clubs or organizations: Not on file    Relationship status: Not on file  . Intimate partner violence:    Fear of current or ex partner: Not on file  Emotionally abused: Not on file    Physically abused: Not on file    Forced sexual activity: Not on file  Other Topics Concern  . Not on file  Social History Narrative  . Not on file     Constitutional: Patient reports fatigue, intermittent headaches.  Denies fever, malaise or abrupt weight changes.  HEENT: Denies eye pain, eye redness, ear pain, ringing in the ears, wax buildup, runny nose, nasal congestion, bloody nose, or sore throat. Respiratory: Denies difficulty breathing, shortness of breath, cough or sputum production.   Cardiovascular: Denies chest pain, chest tightness, palpitations or swelling in the hands or feet.  Gastrointestinal: Patient reports nausea.  Denies abdominal pain, bloating, constipation, diarrhea or blood in the stool.  GU: Denies urgency, frequency, pain with urination, burning sensation,  blood in urine, odor or discharge. Musculoskeletal: Denies decrease in range of motion, difficulty with gait, muscle pain or joint pain and swelling.  Skin: Denies redness, rashes, lesions or ulcercations.  Neurological: Denies dizziness, difficulty with memory, difficulty with speech or problems with balance and coordination.  Psych: Patient reports stress.  Denies anxiety, depression, SI/HI.  No other specific complaints in a complete review of systems (except as listed in HPI above).  Objective:   Physical Exam   BP 104/66   Pulse 88   Temp 98.2 F (36.8 C) (Oral)   Wt 135 lb (61.2 kg)   LMP 12/10/2018   SpO2 99%   BMI 23.17 kg/m  Wt Readings from Last 3 Encounters:  01/03/19 135 lb (61.2 kg) (64 %, Z= 0.36)*  10/10/18 137 lb (62.1 kg) (68 %, Z= 0.47)*  08/02/18 134 lb (60.8 kg) (64 %, Z= 0.37)*   * Growth percentiles are based on CDC (Girls, 2-20 Years) data.    General: Appears her stated age, well developed, well nourished in NAD. Skin: Warm, dry and intact. No rashes noted. HEENT: Head: normal shape and size; Eyes: sclera white, no icterus, conjunctiva pink, PERRLA and EOMs intact; Ears: Tm's gray and intact, normal light reflex;Throat/Mouth: Teeth present, mucosa pink and moist, no exudate, lesions or ulcerations noted.  Neck: Bilateral anterior cervical adenopathy noted. Cardiovascular: Normal rate and rhythm. S1,S2 noted.  No murmur, rubs or gallops noted.  Pulmonary/Chest: Normal effort and positive vesicular breath sounds. No respiratory distress. No wheezes, rales or ronchi noted.  Abdomen: Soft and nontender. Normal bowel sounds. No distention or masses noted.  Neurological: Alert and oriented.  Psychiatric: Mood and affect normal. Behavior is normal. Judgment and thought content normal.     BMET    Component Value Date/Time   NA 139 01/03/2019 1606   K 4.4 01/03/2019 1606   CL 102 01/03/2019 1606   CO2 27 01/03/2019 1606   GLUCOSE 102 (H) 01/03/2019 1606    BUN 11 01/03/2019 1606   CREATININE 0.76 01/03/2019 1606   CALCIUM 9.6 01/03/2019 1606    Lipid Panel     Component Value Date/Time   CHOL 149 10/10/2018 1536   TRIG 187.0 (H) 10/10/2018 1536   HDL 55.70 10/10/2018 1536   CHOLHDL 3 10/10/2018 1536   VLDL 37.4 10/10/2018 1536   LDLCALC 56 10/10/2018 1536    CBC    Component Value Date/Time   WBC 8.6 01/03/2019 1606   RBC 4.30 01/03/2019 1606   HGB 13.0 01/03/2019 1606   HCT 38.5 01/03/2019 1606   PLT 329.0 01/03/2019 1606   MCV 89.4 01/03/2019 1606   MCHC 33.7 01/03/2019 1606   RDW 12.7 01/03/2019  1606    Hgb A1C No results found for: HGBA1C        Assessment & Plan:  Fatigue, Nausea, Headaches, Cervical Adenopathy:  Exam benign Urine pregnancy test negative Will obtain CBC, C met, TSH, mono and serum hCG  We will follow-up after labs are back, return precautions discussed Webb Silversmith, NP

## 2019-01-04 ENCOUNTER — Encounter: Payer: Self-pay | Admitting: Internal Medicine

## 2019-01-04 LAB — CBC
HEMATOCRIT: 38.5 % (ref 36.0–49.0)
HEMOGLOBIN: 13 g/dL (ref 12.0–16.0)
MCHC: 33.7 g/dL (ref 31.0–37.0)
MCV: 89.4 fl (ref 78.0–98.0)
Platelets: 329 10*3/uL (ref 150.0–575.0)
RBC: 4.3 Mil/uL (ref 3.80–5.70)
RDW: 12.7 % (ref 11.4–15.5)
WBC: 8.6 10*3/uL (ref 4.5–13.5)

## 2019-01-04 LAB — COMPREHENSIVE METABOLIC PANEL
ALBUMIN: 4.3 g/dL (ref 3.5–5.2)
ALT: 12 U/L (ref 0–35)
AST: 13 U/L (ref 0–37)
Alkaline Phosphatase: 54 U/L (ref 47–119)
BUN: 11 mg/dL (ref 6–23)
CALCIUM: 9.6 mg/dL (ref 8.4–10.5)
CHLORIDE: 102 meq/L (ref 96–112)
CO2: 27 meq/L (ref 19–32)
Creatinine, Ser: 0.76 mg/dL (ref 0.40–1.20)
GFR: 97.81 mL/min (ref >60.00)
Glucose, Bld: 102 mg/dL — ABNORMAL HIGH (ref 70–99)
Potassium: 4.4 mEq/L (ref 3.5–5.1)
Sodium: 139 mEq/L (ref 135–145)
Total Bilirubin: 0.3 mg/dL (ref 0.2–1.2)
Total Protein: 6.9 g/dL (ref 6.0–8.3)

## 2019-01-04 LAB — TSH: TSH: 2.34 u[IU]/mL (ref 0.40–5.00)

## 2019-01-04 LAB — HCG, SERUM, QUALITATIVE: Preg, Serum: NEGATIVE

## 2019-01-04 LAB — MONONUCLEOSIS SCREEN: Mono Screen: NEGATIVE

## 2019-01-04 NOTE — Patient Instructions (Signed)
Nausea, Adult Nausea is feeling sick to your stomach or feeling that you are about to throw up (vomit). Feeling sick to your stomach is usually not serious, but it may be an early sign of a more serious medical problem. As you feel sicker to your stomach, you may throw up. If you throw up, or if you are not able to drink enough fluids, there is a risk that you may lose too much water in your body (get dehydrated). If you lose too much water in your body, you may:  Feel tired.  Feel thirsty.  Have a dry mouth.  Have cracked lips.  Go pee (urinate) less often. Older adults and people who have other diseases or a weak body defense system (immune system) have a higher risk of losing too much water in the body. The main goals of treating this condition are:  To relieve your nausea.  To ensure your nausea occurs less often.  To prevent throwing up and losing too much fluid. Follow these instructions at home: Watch your symptoms for any changes. Tell your doctor about them. Follow these instructions as told by your doctor. Eating and drinking      Take an ORS (oral rehydration solution). This is a drink that is sold at pharmacies and stores.  Drink clear fluids in small amounts as you are able. These include: ? Water. ? Ice chips. ? Fruit juice that has water added (diluted fruit juice). ? Low-calorie sports drinks.  Eat bland, easy-to-digest foods in small amounts as you are able, such as: ? Bananas. ? Applesauce. ? Rice. ? Low-fat (lean) meats. ? Toast. ? Crackers.  Avoid drinking fluids that have a lot of sugar or caffeine in them. This includes energy drinks, sports drinks, and soda.  Avoid alcohol.  Avoid spicy or fatty foods. General instructions  Take over-the-counter and prescription medicines only as told by your doctor.  Rest at home while you get better.  Drink enough fluid to keep your pee (urine) pale yellow.  Take slow and deep breaths when you feel  sick to your stomach.  Avoid food or things that have strong smells.  Wash your hands often with soap and water. If you cannot use soap and water, use hand sanitizer.  Make sure that all people in your home wash their hands well and often.  Keep all follow-up visits as told by your doctor. This is important. Contact a doctor if:  You feel sicker to your stomach.  You feel sick to your stomach for more than 2 days.  You throw up.  You are not able to drink fluids without throwing up.  You have new symptoms.  You have a fever.  You have a headache.  You have muscle cramps.  You have a rash.  You have pain while peeing.  You feel light-headed or dizzy. Get help right away if:  You have pain in your chest, neck, arm, or jaw.  You feel very weak or you pass out (faint).  You have throw up that is bright red or looks like coffee grounds.  You have bloody or black poop (stools) or poop that looks like tar.  You have a very bad headache, a stiff neck, or both.  You have very bad pain, cramping, or bloating in your belly (abdomen).  You have trouble breathing or you are breathing very quickly.  Your heart is beating very quickly.  Your skin feels cold and clammy.  You feel confused.    You have signs of losing too much water in your body, such as: ? Dark pee, very little pee, or no pee. ? Cracked lips. ? Dry mouth. ? Sunken eyes. ? Sleepiness. ? Weakness. These symptoms may be an emergency. Do not wait to see if the symptoms will go away. Get medical help right away. Call your local emergency services (911 in the [place] Do not drive yourself to the hospital. Summary  Nausea is feeling sick to your stomach or feeling that you are about to throw up (vomit).  If you throw up, or if you are not able to drink enough fluids, there is a risk that you may lose too much water in your body (get dehydrated).  Eat and drink what your doctor tells you. Take  over-the-counter and prescription medicines only as told by your doctor.  Contact a doctor right away if your symptoms get worse or you have new symptoms.  Keep all follow-up visits as told by your doctor. This is important. This information is not intended to replace advice given to you by your health care provider. Make sure you discuss any questions you have with your health care provider. Document Released: 11/10/2011 Document Revised: 05/01/2018 Document Reviewed: 05/01/2018 Elsevier Interactive Patient Education  2019 [employer]vier Inc.  

## 2019-01-14 ENCOUNTER — Telehealth: Payer: Self-pay | Admitting: Internal Medicine

## 2019-01-14 NOTE — Telephone Encounter (Signed)
Pt need refill for Sprintec   Sent to [facility]

## 2019-01-15 ENCOUNTER — Other Ambulatory Visit: Payer: Self-pay | Admitting: Internal Medicine

## 2019-01-15 MED ORDER — NORGESTIMATE-ETH ESTRADIOL 0.25-35 MG-MCG PO TABS: 1.0000 | ORAL_TABLET | Freq: Every day | ORAL | 5 refills | Status: DC

## 2019-01-15 NOTE — Telephone Encounter (Signed)
Patient's mother,Belinda,called.  Patient was suppose to start Sprintec on Sunday.  Belinda called [facility] on Saturday or Sunday for the refill.  Patient's mother is concerned because normally the refill is called in to [facility] sooner. Massie Bougie said she needs the medication called in today. Please call Belinda when rx is called in to [facility] or with reason why it won't be called in to pharmacy. Belinda's phone number is [phone].

## 2019-01-15 NOTE — Telephone Encounter (Signed)
This has already been refilled, duplicate request

## 2019-01-15 NOTE — Telephone Encounter (Signed)
We never received a refill request via fax or electronically, Rx sent through e-scribe

## 2019-02-04 ENCOUNTER — Telehealth: Payer: Self-pay | Admitting: Internal Medicine

## 2019-02-04 NOTE — Telephone Encounter (Signed)
Ok to refill epipen. She can use Famotadine 10 mg PO QHS

## 2019-02-04 NOTE — Telephone Encounter (Signed)
Pt need refill for   Epi pen    Sent to [facility]    Pt's mom also need to know what does pt need to take since Zantac is off the market.

## 2019-02-06 MED ORDER — EPINEPHRINE 0.3 MG/0.3ML IJ SOAJ: 0.3000 mg | INTRAMUSCULAR | 0 refills | Status: DC | PRN

## 2019-02-06 NOTE — Telephone Encounter (Signed)
Left detailed msg on VM per HIPAA  

## 2019-02-06 NOTE — Addendum Note (Signed)
Addended by: Roena Malady on: 02/06/2019 11:22 AM   Modules accepted: Orders

## 2019-02-25 ENCOUNTER — Ambulatory Visit (INDEPENDENT_AMBULATORY_CARE_PROVIDER_SITE_OTHER): Payer: PRIVATE HEALTH INSURANCE | Admitting: Internal Medicine

## 2019-02-25 ENCOUNTER — Other Ambulatory Visit: Payer: Self-pay

## 2019-02-25 ENCOUNTER — Encounter: Payer: Self-pay | Admitting: Internal Medicine

## 2019-02-25 DIAGNOSIS — J00 Acute nasopharyngitis [common cold]: Secondary | ICD-10-CM | POA: Diagnosis not present

## 2019-02-25 DIAGNOSIS — J301 Allergic rhinitis due to pollen: Secondary | ICD-10-CM | POA: Diagnosis not present

## 2019-02-25 MED ORDER — AZITHROMYCIN 250 MG PO TABS: ORAL_TABLET | ORAL | 0 refills | Status: DC

## 2019-02-25 NOTE — Patient Instructions (Signed)

## 2019-02-25 NOTE — Progress Notes (Signed)
Virtual Visit via Video Note  I connected with Emma Gaines on 02/25/19 at 11:30 am by a video enabled telemedicine application and verified that I am speaking with the correct person using two identifiers.   I discussed the limitations of evaluation and management by telemedicine and the availability of in person appointments. The patient expressed understanding and agreed to proceed.  History of Present Illness: Pt reports headache, nasal congestion and cough. This started 2 weeks ago but worsened last night. The headache is located in her forehead. She describes the pain as pressure. She denies dizziness or visual changes. She is blowing yellow mucous out of her nose. The cough is productive of yellow/green mucous. She denies runny nose, ear pain, sore throat or shortness of breath. She denies fever, chills or body aches. She has tried Tylenol Cold and Sinus as needed with minimal relief. She has not had sick contacts. She has no history of allergies. She does not smoke.    Observations/Objective: Alert and oriented x 3. Able to speak in complete sentence. Obvious nasal congestion. No hoarseness or cough noted while on the phone.    Assessment and Plan:  Allergic Rhinitis:  Start Zyrtec and Flonase OTC  Upper Resp Infection:  Get some rest and drink plenty of fluids Start Zyrtec and Flonase OTC RX for Azithromax sent to pharmacy [place] as needed for cough Return precautions discussed    Follow Up Instructions:    I discussed the assessment and treatment plan with the patient. The patient was provided an opportunity to ask questions and all were answered. The patient agreed with the plan and demonstrated an understanding of the instructions.   The patient was advised to call back or seek an in-person evaluation if the symptoms worsen or if the condition fails to improve as anticipated.  I provided 5 minutes of non-face-to-face time during this encounter.   Nicki Reaper, NP

## 2019-06-26 ENCOUNTER — Other Ambulatory Visit: Payer: Self-pay | Admitting: Internal Medicine

## 2019-06-27 NOTE — Telephone Encounter (Signed)
Belinda (Mom) called checking on rx.  They are going out town and needs refill  please advise when called in [facility] number [phone]

## 2019-09-17 ENCOUNTER — Other Ambulatory Visit: Payer: Self-pay | Admitting: Internal Medicine

## 2019-09-17 NOTE — Telephone Encounter (Signed)
Patient's mom called about refill. She is not sure why she needs to call time she is needing a refill and why there are not already reills at the pharmacy  Mom would like the medication sent with another refill so she does not have to call again when its time to refill

## 2019-10-23 ENCOUNTER — Other Ambulatory Visit: Payer: Self-pay

## 2019-10-23 ENCOUNTER — Encounter: Payer: Self-pay | Admitting: Internal Medicine

## 2019-10-23 ENCOUNTER — Ambulatory Visit (INDEPENDENT_AMBULATORY_CARE_PROVIDER_SITE_OTHER): Payer: PRIVATE HEALTH INSURANCE | Admitting: Internal Medicine

## 2019-10-23 DIAGNOSIS — F419 Anxiety disorder, unspecified: Secondary | ICD-10-CM | POA: Diagnosis not present

## 2019-10-23 DIAGNOSIS — R519 Headache, unspecified: Secondary | ICD-10-CM | POA: Diagnosis not present

## 2019-10-23 NOTE — Patient Instructions (Signed)
Health Maintenance, Female Adopting a healthy lifestyle and getting preventive care are important in promoting health and wellness. Ask your health care provider about:  The right schedule for you to have regular tests and exams.  Things you can do on your own to prevent diseases and keep yourself healthy. What should I know about diet, weight, and exercise? Eat a healthy diet   Eat a diet that includes plenty of vegetables, fruits, low-fat dairy products, and lean protein.  Do not eat a lot of foods that are high in solid fats, added sugars, or sodium. Maintain a healthy weight Body mass index (BMI) is used to identify weight problems. It estimates body fat based on height and weight. Your health care provider can help determine your BMI and help you achieve or maintain a healthy weight. Get regular exercise Get regular exercise. This is one of the most important things you can do for your health. Most adults should:  Exercise for at least 150 minutes each week. The exercise should increase your heart rate and make you sweat (moderate-intensity exercise).  Do strengthening exercises at least twice a week. This is in addition to the moderate-intensity exercise.  Spend less time sitting. Even light physical activity can be beneficial. Watch cholesterol and blood lipids Have your blood tested for lipids and cholesterol at 20 years of age, then have this test every 5 years. Have your cholesterol levels checked more often if:  Your lipid or cholesterol levels are high.  You are older than 20 years of age.  You are at high risk for heart disease. What should I know about cancer screening? Depending on your health history and family history, you may need to have cancer screening at various ages. This may include screening for:  Breast cancer.  Cervical cancer.  Colorectal cancer.  Skin cancer.  Lung cancer. What should I know about heart disease, diabetes, and high blood  pressure? Blood pressure and heart disease  High blood pressure causes heart disease and increases the risk of stroke. This is more likely to develop in people who have high blood pressure readings, are of African descent, or are overweight.  Have your blood pressure checked: ? Every 3-5 years if you are 18-39 years of age. ? Every year if you are 40 years old or older. Diabetes Have regular diabetes screenings. This checks your fasting blood sugar level. Have the screening done:  Once every three years after age 40 if you are at a normal weight and have a low risk for diabetes.  More often and at a younger age if you are overweight or have a high risk for diabetes. What should I know about preventing infection? Hepatitis B If you have a higher risk for hepatitis B, you should be screened for this virus. Talk with your health care provider to find out if you are at risk for hepatitis B infection. Hepatitis C Testing is recommended for:  Everyone born from 1945 through 1965.  Anyone with known risk factors for hepatitis C. Sexually transmitted infections (STIs)  Get screened for STIs, including gonorrhea and chlamydia, if: ? You are sexually active and are younger than 20 years of age. ? You are older than 20 years of age and your health care provider tells you that you are at risk for this type of infection. ? Your sexual activity has changed since you were last screened, and you are at increased risk for chlamydia or gonorrhea. Ask your health care provider if   you are at risk.  Ask your health care provider about whether you are at high risk for HIV. Your health care provider may recommend a prescription medicine to help prevent HIV infection. If you choose to take medicine to prevent HIV, you should first get tested for HIV. You should then be tested every 3 months for as long as you are taking the medicine. Pregnancy  If you are about to stop having your period (premenopausal) and  you may become pregnant, seek counseling before you get pregnant.  Take 400 to 800 micrograms (mcg) of folic acid every day if you become pregnant.  Ask for birth control (contraception) if you want to prevent pregnancy. Osteoporosis and menopause Osteoporosis is a disease in which the bones lose minerals and strength with aging. This can result in bone fractures. If you are 65 years old or older, or if you are at risk for osteoporosis and fractures, ask your health care provider if you should:  Be screened for bone loss.  Take a calcium or vitamin D supplement to lower your risk of fractures.  Be given hormone replacement therapy (HRT) to treat symptoms of menopause. Follow these instructions at home: Lifestyle  Do not use any products that contain nicotine or tobacco, such as cigarettes, e-cigarettes, and chewing tobacco. If you need help quitting, ask your health care provider.  Do not use street drugs.  Do not share needles.  Ask your health care provider for help if you need support or information about quitting drugs. Alcohol use  Do not drink alcohol if: ? Your health care provider tells you not to drink. ? You are pregnant, may be pregnant, or are planning to become pregnant.  If you drink alcohol: ? Limit how much you use to 0-1 drink a day. ? Limit intake if you are breastfeeding.  Be aware of how much alcohol is in your drink. In the [place] one drink equals one 12 oz bottle of beer (355 mL), one 5 oz glass of wine (148 mL), or one 1 oz glass of hard liquor (44 mL). General instructions  Schedule regular health, dental, and eye exams.  Stay current with your vaccines.  Tell your health care provider if: ? You often feel depressed. ? You have ever been abused or do not feel safe at home. Summary  Adopting a healthy lifestyle and getting preventive care are important in promoting health and wellness.  Follow your health care provider's instructions about healthy  diet, exercising, and getting tested or screened for diseases.  Follow your health care provider's instructions on monitoring your cholesterol and blood pressure. This information is not intended to replace advice given to you by your health care provider. Make sure you discuss any questions you have with your health care provider. Document Released: 06/06/2011 Document Revised: 11/14/2018 Document Reviewed: 11/14/2018 Elsevier Patient Education  2020 [employer]  

## 2019-10-23 NOTE — Assessment & Plan Note (Signed)
Controlled off meds  Will monitor 

## 2019-10-23 NOTE — Progress Notes (Signed)
Subjective:    Patient ID: Emma Gaines, female    DOB: 2001/03/26, 18 y.o.   MRN: [id number]  HPI  Pt presents to the clinic today for her annual exam.   Frequent Migraine Headaches: Can only recall that headaches occur with certain smells. She takes Naproxen, Zofran PRN for nausea. She also got a daith piercing which seems to help.  Anxiety: No recent issues. Controlled off medications.   Flu: never Tetanus: 2012 Dentist:Biannually Vision: Annually  Diet: She eats meats. She also consumes fruits and veggies daily. She occasionally eats fried foods. She drinks mostly water.  Exercise: She does weight lifting and cardio about 4-6 X a week.    Review of Systems      Past Medical History:  Diagnosis Date  . Frequent headaches     Current Outpatient Medications  Medication Sig Dispense Refill  . albuterol (PROVENTIL HFA;VENTOLIN HFA) 108 (90 Base) MCG/ACT inhaler Inhale 2 puffs into the lungs every 6 (six) hours as needed for wheezing or shortness of breath. 1 Inhaler 0  . azithromycin (ZITHROMAX) 250 MG tablet Take 2 tabs today, then 1 tab daily x 4 days 6 tablet 0  . EPINEPHrine 0.3 mg/0.3 mL IJ SOAJ injection Inject 0.3 mLs (0.3 mg total) into the muscle as needed for anaphylaxis. 1 Device 0  . naproxen (NAPROSYN) 500 MG tablet Take by mouth.    . ondansetron (ZOFRAN) 4 MG tablet Take 1 tablet (4 mg total) by mouth every 8 (eight) hours as needed. 20 tablet 0  . SPRINTEC 28 0.25-35 MG-MCG tablet Take 1 tablet by mouth once daily 28 tablet 1   No current facility-administered medications for this visit.     Allergies  Allergen Reactions  . Other Anaphylaxis    CORN DOG    Family History  Problem Relation Age of Onset  . Lung cancer Maternal Grandmother   . Alcohol abuse Maternal Grandfather   . Colon cancer Paternal Uncle [id number]    Social History   Socioeconomic History  . Marital status: Single    Spouse name: Not on file  . Number of children: Not  on file  . Years of education: Not on file  . Highest education level: Not on file  Occupational History  . Not on file  Social Needs  . Financial resource strain: Not on file  . Food insecurity    Worry: Not on file    Inability: Not on file  . Transportation needs    Medical: Not on file    Non-medical: Not on file  Tobacco Use  . Smoking status: Never Smoker  . Smokeless tobacco: Never Used  Substance and Sexual Activity  . Alcohol use: No    Alcohol/week: 0.0 standard drinks  . Drug use: No  . Sexual activity: Yes  Lifestyle  . Physical activity    Days per week: Not on file    Minutes per session: Not on file  . Stress: Not on file  Relationships  . Social Musicianconnections    Talks on phone: Not on file    Gets together: Not on file    Attends religious service: Not on file    Active member of club or organization: Not on file    Attends meetings of clubs or organizations: Not on file    Relationship status: Not on file  . Intimate partner violence    Fear of current or ex partner: Not on file    Emotionally abused: Not  on file    Physically abused: Not on file    Forced sexual activity: Not on file  Other Topics Concern  . Not on file  Social History Narrative  . Not on file     Constitutional: Pt reports intermittent headaches. Denies fever, malaise, fatigue, or abrupt weight changes.  HEENT: Denies eye pain, eye redness, ear pain, ringing in the ears, wax buildup, runny nose, nasal congestion, bloody nose, or sore throat. Respiratory: Denies difficulty breathing, shortness of breath, cough or sputum production.   Cardiovascular: Denies chest pain, chest tightness, palpitations or swelling in the hands or feet.  Gastrointestinal: Denies abdominal pain, bloating, constipation, diarrhea or blood in the stool.  GU: Denies urgency, frequency, pain with urination, burning sensation, blood in urine, odor or discharge. Musculoskeletal: Denies decrease in range of  motion, difficulty with gait, muscle pain or joint pain and swelling.  Skin: Denies redness, rashes, lesions or ulcercations.  Neurological: Denies dizziness, difficulty with memory, difficulty with speech or problems with balance and coordination.  Psych: Denies anxiety, depression, SI/HI.  No other specific complaints in a complete review of systems (except as listed in HPI above).  Objective:   Physical Exam   BP 108/70   Pulse 77   Temp 98.7 F (37.1 C) (Temporal)   Ht 5\' 4"  (1.626 m)   Wt 144 lb (65.3 kg)   LMP 10/08/2019   SpO2 98%   BMI 24.72 kg/m   Wt Readings from Last 3 Encounters:  01/03/19 135 lb (61.2 kg) (64 %, Z= 0.36)*  10/10/18 137 lb (62.1 kg) (68 %, Z= 0.47)*  08/02/18 134 lb (60.8 kg) (64 %, Z= 0.37)*   * Growth percentiles are based on CDC (Girls, 2-20 Years) data.    General: Appears her stated age, well developed, well nourished in NAD. Skin: Warm, dry and intact. No rashes noted. HEENT: Head: normal shape and size; Eyes: sclera white, no icterus, conjunctiva pink and EOMs intact Neck:  Neck supple, trachea midline. No masses, lumps or thyromegaly present.  Cardiovascular: Normal rate and rhythm. S1,S2 noted.  No murmur, rubs or gallops noted. No JVD or BLE edema.  Pulmonary/Chest: Normal effort and positive vesicular breath sounds. No respiratory distress. No wheezes, rales or ronchi noted.  Abdomen: Soft and nontender. Normal bowel sounds. No distention or masses noted. Liver, spleen and kidneys non palpable. Musculoskeletal: 5+ strength to bilateral upper/lower extremity. No difficulty with gait.  Neurological: Alert and oriented. Cranial nerves II-XII grossly intact. Coordination normal.  Psychiatric: Mood and affect normal. Behavior is normal. Judgment and thought content normal.    BMET    Component Value Date/Time   NA 139 01/03/2019 1606   K 4.4 01/03/2019 1606   CL 102 01/03/2019 1606   CO2 27 01/03/2019 1606   GLUCOSE 102 (H)  01/03/2019 1606   BUN 11 01/03/2019 1606   CREATININE 0.76 01/03/2019 1606   CALCIUM 9.6 01/03/2019 1606    Lipid Panel     Component Value Date/Time   CHOL 149 10/10/2018 1536   TRIG 187.0 (H) 10/10/2018 1536   HDL 55.70 10/10/2018 1536   CHOLHDL 3 10/10/2018 1536   VLDL 37.4 10/10/2018 1536   LDLCALC 56 10/10/2018 1536    CBC    Component Value Date/Time   WBC 8.6 01/03/2019 1606   RBC 4.30 01/03/2019 1606   HGB 13.0 01/03/2019 1606   HCT 38.5 01/03/2019 1606   PLT 329.0 01/03/2019 1606   MCV 89.4 01/03/2019 1606  MCHC 33.7 01/03/2019 1606   RDW 12.7 01/03/2019 1606    Hgb A1C No results found for: HGBA1C         Assessment & Plan:  Preventative Health Maintenance:  She declines flu shot today Tetanus UTD Encouraged her to consume a balanced diet and regular exercise regimen Encouraged her to see a dentist annually Labs not indicated at this time  RTC in 1 year, sooner if needed

## 2019-10-23 NOTE — Assessment & Plan Note (Signed)
Controlled on prn Naproxen and Zofran Avoid triggers  Will monitor

## 2019-11-14 ENCOUNTER — Other Ambulatory Visit: Payer: Self-pay | Admitting: Internal Medicine

## 2019-11-14 NOTE — Telephone Encounter (Signed)
Belinda (mom) called checking on rx  Pt is out    Mom called rx spintec

## 2019-11-14 NOTE — Telephone Encounter (Signed)
Pt should not be due, need to know if pt is taking placebo week of pills or skipping going straight for new pack of pills  If pt does not take the placebo week, we will need to note to pharmacy that pt is taking continuous

## 2020-01-01 ENCOUNTER — Encounter: Payer: Self-pay | Admitting: Internal Medicine

## 2020-01-01 ENCOUNTER — Ambulatory Visit (INDEPENDENT_AMBULATORY_CARE_PROVIDER_SITE_OTHER): Payer: PRIVATE HEALTH INSURANCE | Admitting: Internal Medicine

## 2020-01-01 ENCOUNTER — Other Ambulatory Visit: Payer: Self-pay

## 2020-01-01 DIAGNOSIS — F329 Major depressive disorder, single episode, unspecified: Secondary | ICD-10-CM

## 2020-01-01 DIAGNOSIS — F419 Anxiety disorder, unspecified: Secondary | ICD-10-CM | POA: Diagnosis not present

## 2020-01-01 MED ORDER — BUPROPION HCL ER (XL) 150 MG PO TB24: 150.0000 mg | ORAL_TABLET | Freq: Every day | ORAL | 2 refills | Status: DC

## 2020-01-01 NOTE — Progress Notes (Signed)
Virtual Visit via Video Note  I connected with Emma Gaines on 01/01/20 at  3:45 PM EST by a video enabled telemedicine application and verified that I am speaking with the correct person using two identifiers.  Location: Patient: Home Provider: Office   I discussed the limitations of evaluation and management by telemedicine and the availability of in person appointments. The patient expressed understanding and agreed to proceed.  History of Present Illness:  Pt wanting to follow up anxiety and depression. Her anxiety has increased this month and occurs daily. She reports lack of motivation and decreased concentration. She does feel sad and down at times but not hopeless. She is not seeing a therapist but is interested in seeing one. She has taken Wellbutrin and Hydroxyzine in the past with good relief. She denies SI/HI.    Past Medical History:  Diagnosis Date  . Frequent headaches     Current Outpatient Medications  Medication Sig Dispense Refill  . albuterol (PROVENTIL HFA;VENTOLIN HFA) 108 (90 Base) MCG/ACT inhaler Inhale 2 puffs into the lungs every 6 (six) hours as needed for wheezing or shortness of breath. 1 Inhaler 0  . EPINEPHrine 0.3 mg/0.3 mL IJ SOAJ injection Inject 0.3 mLs (0.3 mg total) into the muscle as needed for anaphylaxis. 1 Device 0  . naproxen (NAPROSYN) 500 MG tablet Take by mouth.    . ondansetron (ZOFRAN) 4 MG tablet Take 1 tablet (4 mg total) by mouth every 8 (eight) hours as needed. 20 tablet 0  . SPRINTEC 28 0.25-35 MG-MCG tablet Take 1 tablet by mouth once daily 28 tablet 5   No current facility-administered medications for this visit.    Allergies  Allergen Reactions  . Other Anaphylaxis    CORN DOG    Family History  Problem Relation Age of Onset  . Lung cancer Maternal Grandmother   . Alcohol abuse Maternal Grandfather   . Colon cancer Paternal Uncle 20    Social History   Socioeconomic History  . Marital status: Single   Spouse name: Not on file  . Number of children: Not on file  . Years of education: Not on file  . Highest education level: Not on file  Occupational History  . Not on file  Tobacco Use  . Smoking status: Never Smoker  . Smokeless tobacco: Never Used  Substance and Sexual Activity  . Alcohol use: No    Alcohol/week: 0.0 standard drinks  . Drug use: No  . Sexual activity: Yes  Other Topics Concern  . Not on file  Social History Narrative  . Not on file   Social Determinants of Health   Financial Resource Strain:   . Difficulty of Paying Living Expenses: Not on file  Food Insecurity:   . Worried About Charity fundraiser in the Last Year: Not on file  . Ran Out of Food in the Last Year: Not on file  Transportation Needs:   . Lack of Transportation (Medical): Not on file  . Lack of Transportation (Non-Medical): Not on file  Physical Activity:   . Days of Exercise per Week: Not on file  . Minutes of Exercise per Session: Not on file  Stress:   . Feeling of Stress : Not on file  Social Connections:   . Frequency of Communication with Friends and Family: Not on file  . Frequency of Social Gatherings with Friends and Family: Not on file  . Attends Religious Services: Not on file  . Active Member of Clubs  or Organizations: Not on file  . Attends Banker Meetings: Not on file  . Marital Status: Not on file  Intimate Partner Violence:   . Fear of Current or Ex-Partner: Not on file  . Emotionally Abused: Not on file  . Physically Abused: Not on file  . Sexually Abused: Not on file     Constitutional: Denies fever, malaise, fatigue, headache or abrupt weight changes.  Neurological: Reports decreased concentration. Denies dizziness, difficulty with memory, difficulty with speech or problems with balance and coordination.  Psych: Pt reports anxiety and depression. Denies SI/HI.  No other specific complaints in a complete review of systems (except as listed in HPI  above).  Observations/Objective:   Wt Readings from Last 3 Encounters:  10/23/19 144 lb (65.3 kg)  01/03/19 135 lb (61.2 kg) (64 %, Z= 0.36)*  10/10/18 137 lb (62.1 kg) (68 %, Z= 0.47)*   * Growth percentiles are based on CDC (Girls, 2-20 Years) data.    General: Appears her stated age, well developed, well nourished in NAD. Pulmonary/Chest: Normal effort. No respiratory distress.  Neurological: Alert and oriented. Cranial nerves II-XII grossly intact. Coordination normal.  Psychiatric: Mood and affect mildly flat. Behavior is normal. Judgment and thought content normal.    BMET    Component Value Date/Time   NA 139 01/03/2019 1606   K 4.4 01/03/2019 1606   CL 102 01/03/2019 1606   CO2 27 01/03/2019 1606   GLUCOSE 102 (H) 01/03/2019 1606   BUN 11 01/03/2019 1606   CREATININE 0.76 01/03/2019 1606   CALCIUM 9.6 01/03/2019 1606    Lipid Panel     Component Value Date/Time   CHOL 149 10/10/2018 1536   TRIG 187.0 (H) 10/10/2018 1536   HDL 55.70 10/10/2018 1536   CHOLHDL 3 10/10/2018 1536   VLDL 37.4 10/10/2018 1536   LDLCALC 56 10/10/2018 1536    CBC    Component Value Date/Time   WBC 8.6 01/03/2019 1606   RBC 4.30 01/03/2019 1606   HGB 13.0 01/03/2019 1606   HCT 38.5 01/03/2019 1606   PLT 329.0 01/03/2019 1606   MCV 89.4 01/03/2019 1606   MCHC 33.7 01/03/2019 1606   RDW 12.7 01/03/2019 1606    Hgb A1C No results found for: HGBA1C     Assessment and Plan:  Anxiety and Depression:  Support offered today Rx sent for Wellbutrin 150mg  po daily, advised to take with food. Referral to psychology placed for CBT  Follow up with me in 1 month for follow up anxiety and depression  Follow Up Instructions:    I discussed the assessment and treatment plan with the patient. The patient was provided an opportunity to ask questions and all were answered. The patient agreed with the plan and demonstrated an understanding of the instructions.   The patient was  advised to call back or seek an in-person evaluation if the symptoms worsen or if the condition fails to improve as anticipated.    , NP

## 2020-01-01 NOTE — Patient Instructions (Signed)

## 2020-02-04 ENCOUNTER — Other Ambulatory Visit: Payer: Self-pay | Admitting: Internal Medicine

## 2020-02-04 DIAGNOSIS — F419 Anxiety disorder, unspecified: Secondary | ICD-10-CM

## 2020-02-04 DIAGNOSIS — F329 Major depressive disorder, single episode, unspecified: Secondary | ICD-10-CM

## 2020-03-12 ENCOUNTER — Ambulatory Visit: Payer: PRIVATE HEALTH INSURANCE | Admitting: Family Medicine

## 2020-03-12 ENCOUNTER — Other Ambulatory Visit: Payer: Self-pay

## 2020-03-12 ENCOUNTER — Encounter: Payer: Self-pay | Admitting: Family Medicine

## 2020-03-12 DIAGNOSIS — S134XXA Sprain of ligaments of cervical spine, initial encounter: Secondary | ICD-10-CM | POA: Diagnosis not present

## 2020-03-12 DIAGNOSIS — M25551 Pain in right hip: Secondary | ICD-10-CM | POA: Diagnosis not present
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
## 2020-03-26 ENCOUNTER — Other Ambulatory Visit: Payer: Self-pay

## 2020-03-26 ENCOUNTER — Encounter: Payer: Self-pay | Admitting: Internal Medicine

## 2020-03-26 ENCOUNTER — Ambulatory Visit (INDEPENDENT_AMBULATORY_CARE_PROVIDER_SITE_OTHER): Payer: PRIVATE HEALTH INSURANCE | Admitting: Internal Medicine

## 2020-03-26 VITALS — BP 108/76 | HR 80 | Temp 98.7°F | Wt 145.0 lb

## 2020-03-26 DIAGNOSIS — M25551 Pain in right hip: Secondary | ICD-10-CM

## 2020-03-26 DIAGNOSIS — S134XXD Sprain of ligaments of cervical spine, subsequent encounter: Secondary | ICD-10-CM | POA: Diagnosis not present
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
## 2020-03-28 ENCOUNTER — Other Ambulatory Visit: Payer: Self-pay | Admitting: Internal Medicine

## 2020-04-13 ENCOUNTER — Ambulatory Visit (INDEPENDENT_AMBULATORY_CARE_PROVIDER_SITE_OTHER): Payer: 59 | Admitting: Psychology

## 2020-04-13 DIAGNOSIS — F411 Generalized anxiety disorder: Secondary | ICD-10-CM

## 2020-05-01 ENCOUNTER — Ambulatory Visit (INDEPENDENT_AMBULATORY_CARE_PROVIDER_SITE_OTHER): Payer: 59 | Admitting: Psychology

## 2020-05-01 DIAGNOSIS — F411 Generalized anxiety disorder: Secondary | ICD-10-CM

## 2020-05-02 ENCOUNTER — Other Ambulatory Visit: Payer: Self-pay | Admitting: Internal Medicine

## 2020-05-08 ENCOUNTER — Telehealth: Payer: Self-pay

## 2020-05-08 NOTE — Telephone Encounter (Signed)
Patient's mother contacted the office and states that she has been inquiring about paperwork for a couple days - and she has not gotten anywhere. She states "everyone I speak to has an attitude, and I am sick of it." She states that her daughter is in college and trying to do an internship, and she needs this paperwork signed and done today by 3:30.  Per Shawna Orleans, patient's daughter needs to sign a records release form before this can be faxed - but if she cannot sign, she could come pick this up.  I attempted to contact mom back but phone line kept ringing. I will try again shortly.
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
## 2020-05-08 NOTE — Telephone Encounter (Signed)
Form completed, placed in MYD box 

## 2020-05-13 ENCOUNTER — Encounter: Payer: PRIVATE HEALTH INSURANCE | Admitting: Internal Medicine

## 2020-05-13 ENCOUNTER — Ambulatory Visit: Payer: 59 | Admitting: Psychology

## 2020-06-14 ENCOUNTER — Other Ambulatory Visit: Payer: Self-pay | Admitting: Internal Medicine

## 2020-06-16 ENCOUNTER — Other Ambulatory Visit: Payer: Self-pay | Admitting: Internal Medicine

## 2020-07-30 ENCOUNTER — Ambulatory Visit (INDEPENDENT_AMBULATORY_CARE_PROVIDER_SITE_OTHER): Payer: 59 | Admitting: Internal Medicine

## 2020-07-30 ENCOUNTER — Encounter: Payer: Self-pay | Admitting: Internal Medicine

## 2020-07-30 ENCOUNTER — Other Ambulatory Visit: Payer: Self-pay

## 2020-07-30 VITALS — BP 110/78 | HR 75 | Ht 64.0 in | Wt 156.0 lb

## 2020-07-30 DIAGNOSIS — L255 Unspecified contact dermatitis due to plants, except food: Secondary | ICD-10-CM | POA: Diagnosis not present

## 2020-07-30 DIAGNOSIS — Z803 Family history of malignant neoplasm of breast: Secondary | ICD-10-CM

## 2020-07-30 DIAGNOSIS — Z800 Family history of malignant neoplasm of digestive organs: Secondary | ICD-10-CM

## 2020-07-30 MED ORDER — PREDNISONE 10 MG PO TABS: ORAL_TABLET | ORAL | 0 refills | Status: DC

## 2020-07-30 NOTE — Progress Notes (Signed)
Subjective:    Patient ID: Emma Gaines, female    DOB: Aug 10, 2001, 19 y.o.   MRN: [id number]  HPI  Patient presents to the clinic today requesting cancer screening.  She reports her dad's sister was recently diagnosed with breast cancer- BRCA negaitve.  Her dad's brother has had colon cancer.  She has not noticed any lumps or masses in her breast.  She has not noticed any discharge from her nipples.  She denies weight loss, bowel changes, constipation, diarrhea or blood in her stool.  She also reports rash.  This started a few days ago.  The rash is located on her abdomen, back, arms and legs.  The rash is very itchy.  She thinks it may be poison ivy as she has been playing with her dog who run around in the backyard where there are some poison ivy plants.  She has not used anything OTC for this.  Review of Systems      Past Medical History:  Diagnosis Date  . Frequent headaches     Current Outpatient Medications  Medication Sig Dispense Refill  . albuterol (PROVENTIL HFA;VENTOLIN HFA) 108 (90 Base) MCG/ACT inhaler Inhale 2 puffs into the lungs every 6 (six) hours as needed for wheezing or shortness of breath. 1 Inhaler 0  . buPROPion (WELLBUTRIN XL) 150 MG 24 hr tablet Take 1 tablet by mouth once daily 30 tablet 2  . EPINEPHrine 0.3 mg/0.3 mL IJ SOAJ injection Inject 0.3 mLs (0.3 mg total) into the muscle as needed for anaphylaxis. 1 Device 0  . naproxen (NAPROSYN) 500 MG tablet Take by mouth.    . norgestimate-ethinyl estradiol (SPRINTEC 28) 0.25-35 MG-MCG tablet Take 1 tablet by mouth daily. 28 tablet 3  . ondansetron (ZOFRAN) 4 MG tablet Take 1 tablet (4 mg total) by mouth every 8 (eight) hours as needed. 20 tablet 0   No current facility-administered medications for this visit.    Allergies  Allergen Reactions  . Other Anaphylaxis    CORN DOG    Family History  Problem Relation Age of Onset  . Lung cancer Maternal Grandmother   . Alcohol abuse Maternal  Grandfather   . Colon cancer Paternal Uncle 38    Social History   Socioeconomic History  . Marital status: Single    Spouse name: Not on file  . Number of children: Not on file  . Years of education: Not on file  . Highest education level: Not on file  Occupational History  . Not on file  Tobacco Use  . Smoking status: Never Smoker  . Smokeless tobacco: Never Used  Substance and Sexual Activity  . Alcohol use: No    Alcohol/week: 0.0 standard drinks  . Drug use: No  . Sexual activity: Yes  Other Topics Concern  . Not on file  Social History Narrative  . Not on file   Social Determinants of Health   Financial Resource Strain:   . Difficulty of Paying Living Expenses: Not on file  Food Insecurity:   . Worried About Charity fundraiser in the Last Year: Not on file  . Ran Out of Food in the Last Year: Not on file  Transportation Needs:   . Lack of Transportation (Medical): Not on file  . Lack of Transportation (Non-Medical): Not on file  Physical Activity:   . Days of Exercise per Week: Not on file  . Minutes of Exercise per Session: Not on file  Stress:   . Feeling  of Stress : Not on file  Social Connections:   . Frequency of Communication with Friends and Family: Not on file  . Frequency of Social Gatherings with Friends and Family: Not on file  . Attends Religious Services: Not on file  . Active Member of Clubs or Organizations: Not on file  . Attends Archivist Meetings: Not on file  . Marital Status: Not on file  Intimate Partner Violence:   . Fear of Current or Ex-Partner: Not on file  . Emotionally Abused: Not on file  . Physically Abused: Not on file  . Sexually Abused: Not on file     Constitutional: Denies fever, malaise, fatigue, headache or abrupt weight changes.  Respiratory: Denies difficulty breathing, shortness of breath, cough or sputum production.   Cardiovascular: Denies chest pain, chest tightness, palpitations or swelling in the  hands or feet.  Skin: Patient reports rash.  Denies ulcercations.    No other specific complaints in a complete review of systems (except as listed in HPI above).  Objective:   Physical Exam   BP 110/78   Pulse 75   Ht _0  (1.626 m)   Wt 156 lb (70.8 kg)   LMP 07/13/2020   SpO2 98%   BMI 26.78 kg/m  Wt Readings from Last 3 Encounters:  07/30/20 156 lb (70.8 kg)  03/26/20 145 lb (65.8 kg)  03/12/20 148 lb 8 oz (67.4 kg)    General: Appears her stated age, well developed, well nourished in NAD. Skin: Scattered vesicular lesions on erythematous base noted of bilateral arms, legs, abdomen and back. Breast: Symmetrical.  No masses or lumps noted.  No discharge expressed from the nipples.  Nipple piercings intact. Cardiovascular: Normal rate and rhythm.  Pulmonary/Chest: Normal effort and positive vesicular breath sounds. Neurological: Alert and oriented.    BMET    Component Value Date/Time   NA 139 01/03/2019 1606   K 4.4 01/03/2019 1606   CL 102 01/03/2019 1606   CO2 27 01/03/2019 1606   GLUCOSE 102 (H) 01/03/2019 1606   BUN 11 01/03/2019 1606   CREATININE 0.76 01/03/2019 1606   CALCIUM 9.6 01/03/2019 1606    Lipid Panel     Component Value Date/Time   CHOL 149 10/10/2018 1536   TRIG 187.0 (H) 10/10/2018 1536   HDL 55.70 10/10/2018 1536   CHOLHDL 3 10/10/2018 1536   VLDL 37.4 10/10/2018 1536   LDLCALC 56 10/10/2018 1536    CBC    Component Value Date/Time   WBC 8.6 01/03/2019 1606   RBC 4.30 01/03/2019 1606   HGB 13.0 01/03/2019 1606   HCT 38.5 01/03/2019 1606   PLT 329.0 01/03/2019 1606   MCV 89.4 01/03/2019 1606   MCHC 33.7 01/03/2019 1606   RDW 12.7 01/03/2019 1606    Hgb A1C No results found for: HGBA1C         Assessment & Plan:   Family history of Breast, Colon Cancer:  Breast exam today benign Discussed starting screening mammograms and colonoscopies at 53 Discussed referral to geneticist for genetic testing but she declines at  this time  Contact Dermatitis due to Plant:  Rx for Pred taper x6 days Can take antihistamine OTC as needed for itching  Return precautions discussed  Webb Silversmith, NP This visit occurred during the SARS-CoV-2 public health emergency.  Safety protocols were in place, including screening questions prior to the visit, additional usage of staff PPE, and extensive cleaning of exam room while observing appropriate contact time  as indicated for disinfecting solutions.

## 2020-07-30 NOTE — Patient Instructions (Signed)

## 2020-08-08 ENCOUNTER — Other Ambulatory Visit: Payer: Self-pay | Admitting: Internal Medicine

## 2020-08-17 ENCOUNTER — Telehealth: Payer: Self-pay

## 2020-08-17 NOTE — Telephone Encounter (Signed)
Form placed in your box.

## 2020-08-19 NOTE — Telephone Encounter (Signed)
Please fill out and I will sign

## 2020-08-20 NOTE — Telephone Encounter (Signed)
Mother is aware--forms placed in front office for pick up

## 2020-08-24 ENCOUNTER — Telehealth: Payer: Self-pay

## 2020-08-24 NOTE — Telephone Encounter (Signed)
Patient stopped by to let us know her [employer] vaccination form, had a incorrect year for MMR immunization. Form was placed in the RX tower.

## 2020-08-25 NOTE — Telephone Encounter (Signed)
Form placed in front office pt's mother is aware

## 2020-09-14 ENCOUNTER — Other Ambulatory Visit: Payer: Self-pay | Admitting: Internal Medicine

## 2020-10-10 ENCOUNTER — Other Ambulatory Visit: Payer: Self-pay | Admitting: Internal Medicine

## 2020-12-16 ENCOUNTER — Other Ambulatory Visit: Payer: Self-pay | Admitting: Internal Medicine

## 2020-12-16 ENCOUNTER — Telehealth: Payer: Self-pay

## 2020-12-16 MED ORDER — NORGESTIMATE-ETH ESTRADIOL 0.25-35 MG-MCG PO TABS: 1.0000 | ORAL_TABLET | Freq: Every day | ORAL | 0 refills | Status: DC

## 2020-12-18 NOTE — Telephone Encounter (Signed)
no

## 2021-02-16 ENCOUNTER — Other Ambulatory Visit: Payer: Self-pay | Admitting: Internal Medicine

## 2021-04-08 ENCOUNTER — Encounter: Payer: 59 | Admitting: Internal Medicine

## 2021-05-05 ENCOUNTER — Ambulatory Visit (INDEPENDENT_AMBULATORY_CARE_PROVIDER_SITE_OTHER): Payer: 59 | Admitting: Internal Medicine

## 2021-05-05 ENCOUNTER — Encounter: Payer: Self-pay | Admitting: Internal Medicine

## 2021-05-05 ENCOUNTER — Other Ambulatory Visit (HOSPITAL_COMMUNITY)
Admission: RE | Admit: 2021-05-05 | Discharge: 2021-05-05 | Disposition: A | Discharge status: Home / Self Care | Payer: 59 | Source: Ambulatory Visit | Attending: Internal Medicine | Admitting: Internal Medicine

## 2021-05-05 ENCOUNTER — Other Ambulatory Visit: Payer: Self-pay

## 2021-05-05 VITALS — BP 108/60 | HR 79 | Temp 97.3°F | Resp 16 | Ht 63.5 in | Wt 155.4 lb

## 2021-05-05 DIAGNOSIS — Z124 Encounter for screening for malignant neoplasm of cervix: Secondary | ICD-10-CM | POA: Insufficient documentation

## 2021-05-05 DIAGNOSIS — Z0001 Encounter for general adult medical examination with abnormal findings: Secondary | ICD-10-CM | POA: Diagnosis not present

## 2021-05-05 DIAGNOSIS — Z113 Encounter for screening for infections with a predominantly sexual mode of transmission: Secondary | ICD-10-CM

## 2021-05-05 DIAGNOSIS — F419 Anxiety disorder, unspecified: Secondary | ICD-10-CM

## 2021-05-05 DIAGNOSIS — F32A Depression, unspecified: Secondary | ICD-10-CM | POA: Diagnosis not present

## 2021-05-05 DIAGNOSIS — Z23 Encounter for immunization: Secondary | ICD-10-CM

## 2021-05-05 DIAGNOSIS — R519 Headache, unspecified: Secondary | ICD-10-CM | POA: Diagnosis not present

## 2021-05-05 NOTE — Patient Instructions (Signed)
Health Maintenance, Female Adopting a healthy lifestyle and getting preventive care are important in promoting health and wellness. Ask your health care provider about:  The right schedule for you to have regular tests and exams.  Things you can do on your own to prevent diseases and keep yourself healthy. What should I know about diet, weight, and exercise? Eat a healthy diet  Eat a diet that includes plenty of vegetables, fruits, low-fat dairy products, and lean protein.  Do not eat a lot of foods that are high in solid fats, added sugars, or sodium.   Maintain a healthy weight Body mass index (BMI) is used to identify weight problems. It estimates body fat based on height and weight. Your health care provider can help determine your BMI and help you achieve or maintain a healthy weight. Get regular exercise Get regular exercise. This is one of the most important things you can do for your health. Most adults should:  Exercise for at least 150 minutes each week. The exercise should increase your heart rate and make you sweat (moderate-intensity exercise).  Do strengthening exercises at least twice a week. This is in addition to the moderate-intensity exercise.  Spend less time sitting. Even light physical activity can be beneficial. Watch cholesterol and blood lipids Have your blood tested for lipids and cholesterol at 22 years of age, then have this test every 5 years. Have your cholesterol levels checked more often if:  Your lipid or cholesterol levels are high.  You are older than 22 years of age.  You are at high risk for heart disease. What should I know about cancer screening? Depending on your health history and family history, you may need to have cancer screening at various ages. This may include screening for:  Breast cancer.  Cervical cancer.  Colorectal cancer.  Skin cancer.  Lung cancer. What should I know about heart disease, diabetes, and high blood  pressure? Blood pressure and heart disease  High blood pressure causes heart disease and increases the risk of stroke. This is more likely to develop in people who have high blood pressure readings, are of African descent, or are overweight.  Have your blood pressure checked: ? Every 3-5 years if you are 18-39 years of age. ? Every year if you are 40 years old or older. Diabetes Have regular diabetes screenings. This checks your fasting blood sugar level. Have the screening done:  Once every three years after age 40 if you are at a normal weight and have a low risk for diabetes.  More often and at a younger age if you are overweight or have a high risk for diabetes. What should I know about preventing infection? Hepatitis B If you have a higher risk for hepatitis B, you should be screened for this virus. Talk with your health care provider to find out if you are at risk for hepatitis B infection. Hepatitis C Testing is recommended for:  Everyone born from 1945 through 1965.  Anyone with known risk factors for hepatitis C. Sexually transmitted infections (STIs)  Get screened for STIs, including gonorrhea and chlamydia, if: ? You are sexually active and are younger than 22 years of age. ? You are older than 22 years of age and your health care provider tells you that you are at risk for this type of infection. ? Your sexual activity has changed since you were last screened, and you are at increased risk for chlamydia or gonorrhea. Ask your health care provider   if you are at risk.  Ask your health care provider about whether you are at high risk for HIV. Your health care provider may recommend a prescription medicine to help prevent HIV infection. If you choose to take medicine to prevent HIV, you should first get tested for HIV. You should then be tested every 3 months for as long as you are taking the medicine. Pregnancy  If you are about to stop having your period (premenopausal) and  you may become pregnant, seek counseling before you get pregnant.  Take 400 to 800 micrograms (mcg) of folic acid every day if you become pregnant.  Ask for birth control (contraception) if you want to prevent pregnancy. Osteoporosis and menopause Osteoporosis is a disease in which the bones lose minerals and strength with aging. This can result in bone fractures. If you are 65 years old or older, or if you are at risk for osteoporosis and fractures, ask your health care provider if you should:  Be screened for bone loss.  Take a calcium or vitamin D supplement to lower your risk of fractures.  Be given hormone replacement therapy (HRT) to treat symptoms of menopause. Follow these instructions at home: Lifestyle  Do not use any products that contain nicotine or tobacco, such as cigarettes, e-cigarettes, and chewing tobacco. If you need help quitting, ask your health care provider.  Do not use street drugs.  Do not share needles.  Ask your health care provider for help if you need support or information about quitting drugs. Alcohol use  Do not drink alcohol if: ? Your health care provider tells you not to drink. ? You are pregnant, may be pregnant, or are planning to become pregnant.  If you drink alcohol: ? Limit how much you use to 0-1 drink a day. ? Limit intake if you are breastfeeding.  Be aware of how much alcohol is in your drink. In the [place] one drink equals one 12 oz bottle of beer (355 mL), one 5 oz glass of wine (148 mL), or one 1 oz glass of hard liquor (44 mL). General instructions  Schedule regular health, dental, and eye exams.  Stay current with your vaccines.  Tell your health care provider if: ? You often feel depressed. ? You have ever been abused or do not feel safe at home. Summary  Adopting a healthy lifestyle and getting preventive care are important in promoting health and wellness.  Follow your health care provider's instructions about healthy  diet, exercising, and getting tested or screened for diseases.  Follow your health care provider's instructions on monitoring your cholesterol and blood pressure. This information is not intended to replace advice given to you by your health care provider. Make sure you discuss any questions you have with your health care provider. Document Revised: 11/14/2018 Document Reviewed: 11/14/2018 Elsevier Patient Education  2021 [employer]vier Inc.  

## 2021-05-05 NOTE — Assessment & Plan Note (Signed)
Encouraged stress reducing measures Continue Ibuprofen as needed

## 2021-05-05 NOTE — Progress Notes (Signed)
Subjective:    Patient ID: Emma Gaines, female    DOB: [birth year], 20 y.o.   MRN: [id number]  HPI  Patient presents the clinic today for her annual exam.  She is also due to follow-up chronic conditions.  Frequent Headaches: These occur a couple times per month. May be triggered by stress and heat.  She takes Ibuprofen as needed with good relief of symptoms.  Anxiety and Depression: Persistent, she is not taking Wellbutrin as prescribed.  She is not currently seeing a therapist.  She denies SI/HI.  Flu: never Tetanus: 03/2011 COVID: never Pap smear: never Dentist: as needed  Diet: She does eat meat. She consumes some fruits and veggies. She does eat some fried foods. She drinks mostly water, hot tea. Exercise: Lifting, martial arts.  Review of Systems      Past Medical History:  Diagnosis Date  . Frequent headaches     Current Outpatient Medications  Medication Sig Dispense Refill  . albuterol (PROVENTIL HFA;VENTOLIN HFA) 108 (90 Base) MCG/ACT inhaler Inhale 2 puffs into the lungs every 6 (six) hours as needed for wheezing or shortness of breath. 1 Inhaler 0  . buPROPion (WELLBUTRIN XL) 150 MG 24 hr tablet Take 1 tablet by mouth once daily 30 tablet 2  . EPINEPHrine 0.3 mg/0.3 mL IJ SOAJ injection Inject 0.3 mLs (0.3 mg total) into the muscle as needed for anaphylaxis. 1 Device 0  . naproxen (NAPROSYN) 500 MG tablet Take by mouth.    . ondansetron (ZOFRAN) 4 MG tablet Take 1 tablet (4 mg total) by mouth every 8 (eight) hours as needed. 20 tablet 0  . predniSONE (DELTASONE) 10 MG tablet Take 6 tabs day 1, 5 tabs day 2, 4 tabs day 3, 3 tabs day 4, 2 tabs day 5, 1 tab day 6 21 tablet 0  . SPRINTEC 28 0.25-35 MG-MCG tablet TAKE 1 TABLET BY MOUTH ONCE DAILY SCHEDULE  PHYSICAL EXAM 84 tablet 0   No current facility-administered medications for this visit.    Allergies  Allergen Reactions  . Other Anaphylaxis    CORN DOG    Family History  Problem Relation Age  of Onset  . Lung cancer Maternal Grandmother   . Alcohol abuse Maternal Grandfather   . Colon cancer Paternal Uncle 23    Social History   Socioeconomic History  . Marital status: Single    Spouse name: Not on file  . Number of children: Not on file  . Years of education: Not on file  . Highest education level: Not on file  Occupational History  . Not on file  Tobacco Use  . Smoking status: Never Smoker  . Smokeless tobacco: Never Used  Substance and Sexual Activity  . Alcohol use: No    Alcohol/week: 0.0 standard drinks  . Drug use: No  . Sexual activity: Yes  Other Topics Concern  . Not on file  Social History Narrative  . Not on file   Social Determinants of Health   Financial Resource Strain: Not on file  Food Insecurity: Not on file  Transportation Needs: Not on file  Physical Activity: Not on file  Stress: Not on file  Social Connections: Not on file  Intimate Partner Violence: Not on file     Constitutional: Patient reports frequent headaches.  Denies fever, malaise, fatigue, or abrupt weight changes.  HEENT: Denies eye pain, eye redness, ear pain, ringing in the ears, wax buildup, runny nose, nasal congestion, bloody nose, or sore throat.  Respiratory: Denies difficulty breathing, shortness of breath, cough or sputum production.   Cardiovascular: Denies chest pain, chest tightness, palpitations or swelling in the hands or feet.  Gastrointestinal: Denies abdominal pain, bloating, constipation, diarrhea or blood in the stool.  GU: Denies urgency, frequency, pain with urination, burning sensation, blood in urine, odor or discharge. Musculoskeletal: Denies decrease in range of motion, difficulty with gait, muscle pain or joint pain and swelling.  Skin: Denies redness, rashes, lesions or ulcercations.  Neurological: Denies dizziness, difficulty with memory, difficulty with speech or problems with balance and coordination.  Psych: Patient has a history of anxiety  and depression.  Denies SI/HI.  No other specific complaints in a complete review of systems (except as listed in HPI above).  Objective:   Physical Exam BP 108/60 (BP Location: Right Arm, Patient Position: Sitting, Cuff Size: Normal)   Pulse 79   Temp (!) 97.3 F (36.3 C) (Temporal)   Resp 16   Ht 5' 3.5" (1.613 m)   Wt 155 lb 6.4 oz (70.5 kg)   LMP 05/01/2021   SpO2 100%   BMI 27.10 kg/m   Wt Readings from Last 3 Encounters:  07/30/20 156 lb (70.8 kg)  03/26/20 145 lb (65.8 kg)  03/12/20 148 lb 8 oz (67.4 kg)    General: Appears her stated age, well developed, well nourished in NAD. Skin: Warm, dry and intact. No rashes, lesions or ulcerations noted. HEENT: Head: normal shape and size; Eyes: sclera white and EOMs intact;  Neck:  Neck supple, trachea midline. No masses, lumps or thyromegaly present.  Cardiovascular: Normal rate and rhythm. S1,S2 noted.  No murmur, rubs or gallops noted. No JVD or BLE edema.  Pulmonary/Chest: Normal effort and positive vesicular breath sounds. No respiratory distress. No wheezes, rales or ronchi noted.  Abdomen: Soft and nontender. Normal bowel sounds. No distention or masses noted. Liver, spleen and kidneys non palpable. Pelvic: Normal female anatomy. Cervix with irritation around the os. No CMT. Adnexa non palpable. Musculoskeletal: Strength 5/5 BUE/BLE. No difficulty with gait.  Neurological: Alert and oriented. Cranial nerves II-XII grossly intact. Coordination normal.  Psychiatric: Mood and affect normal. Behavior is normal. Judgment and thought content normal.     BMET    Component Value Date/Time   NA 139 01/03/2019 1606   K 4.4 01/03/2019 1606   CL 102 01/03/2019 1606   CO2 27 01/03/2019 1606   GLUCOSE 102 (H) 01/03/2019 1606   BUN 11 01/03/2019 1606   CREATININE 0.76 01/03/2019 1606   CALCIUM 9.6 01/03/2019 1606    Lipid Panel     Component Value Date/Time   CHOL 149 10/10/2018 1536   TRIG 187.0 (H) 10/10/2018 1536    HDL 55.70 10/10/2018 1536   CHOLHDL 3 10/10/2018 1536   VLDL 37.4 10/10/2018 1536   LDLCALC 56 10/10/2018 1536    CBC    Component Value Date/Time   WBC 8.6 01/03/2019 1606   RBC 4.30 01/03/2019 1606   HGB 13.0 01/03/2019 1606   HCT 38.5 01/03/2019 1606   PLT 329.0 01/03/2019 1606   MCV 89.4 01/03/2019 1606   MCHC 33.7 01/03/2019 1606   RDW 12.7 01/03/2019 1606    Hgb A1C No results found for: HGBA1C           Assessment & Plan:   Preventative Health Maintenance:  Encouraged her to get a flu shot this fall Tetanus today Encouraged her to get her COVID-vaccine Pap smear today with STD screening Encouraged her to continue  consume a balanced diet and exercise regimen Advised her to see an eye doctor and dentist annually She declines lab work today  RTC 1 year, sooner if needed  Nicki Reaper, NP This visit occurred during the SARS-CoV-2 public health emergency.  Safety protocols were in place, including screening questions prior to the visit, additional usage of staff PPE, and extensive cleaning of exam room while observing appropriate contact time as indicated for disinfecting solutions.

## 2021-05-05 NOTE — Assessment & Plan Note (Signed)
Stable off meds Support offered 

## 2021-05-07 LAB — CERVICOVAGINAL ANCILLARY ONLY
Bacterial Vaginitis (gardnerella): NEGATIVE
Candida Glabrata: NEGATIVE
Candida Vaginitis: NEGATIVE
Chlamydia: NEGATIVE
Comment: NEGATIVE
Comment: NEGATIVE
Comment: NEGATIVE
Comment: NEGATIVE
Comment: NEGATIVE
Comment: NORMAL
Neisseria Gonorrhea: NEGATIVE
Trichomonas: NEGATIVE

## 2021-05-07 LAB — CYTOLOGY - PAP: Diagnosis: NEGATIVE

## 2021-06-21 ENCOUNTER — Other Ambulatory Visit: Payer: Self-pay | Admitting: Internal Medicine

## 2021-09-21 ENCOUNTER — Other Ambulatory Visit: Payer: Self-pay | Admitting: Internal Medicine

## 2021-09-21 NOTE — Telephone Encounter (Signed)
Requested Prescriptions  Pending Prescriptions Disp Refills  . SPRINTEC 28 0.25-35 MG-MCG tablet [Pharmacy Med Name: Sprintec 28 0.25-35 MG-MCG Oral Tablet] 84 tablet 0    Sig: TAKE 1 TABLET BY MOUTH ONCE DAILY NEED  APPOINTMENT  FOR  PHYSICAL     OB/GYN:  Contraceptives Passed - 09/21/2021 11:52 PM      Passed - Last BP in normal range    BP Readings from Last 1 Encounters:  05/05/21 108/60         Passed - Valid encounter within last 12 months    Recent Outpatient Visits          4 months ago Encounter for general adult medical examination with abnormal findings   [facility] [place], Salvadore Oxford, NP

## 2021-12-21 ENCOUNTER — Other Ambulatory Visit: Payer: Self-pay | Admitting: Internal Medicine

## 2021-12-21 NOTE — Telephone Encounter (Signed)
Requested Prescriptions  Pending Prescriptions Disp Refills   SPRINTEC 28 0.25-35 MG-MCG tablet [Pharmacy Med Name: Sprintec 28 0.25-35 MG-MCG Oral Tablet] 84 tablet 0    Sig: TAKE 1 TABLET BY MOUTH ONCE DAILY. APPT NEEDED FOR PHYSICAL     OB/GYN:  Contraceptives Passed - 12/21/2021  5:52 PM      Passed - Last BP in normal range    BP Readings from Last 1 Encounters:  05/05/21 108/60         Passed - Valid encounter within last 12 months    Recent Outpatient Visits          7 months ago Encounter for general adult medical examination with abnormal findings   [facility] [place], Salvadore Oxford, NP

## 2022-01-20 ENCOUNTER — Encounter: Payer: Self-pay | Admitting: Internal Medicine

## 2022-03-14 ENCOUNTER — Encounter: Payer: Self-pay | Admitting: Internal Medicine

## 2022-03-14 ENCOUNTER — Ambulatory Visit (INDEPENDENT_AMBULATORY_CARE_PROVIDER_SITE_OTHER): Payer: BC Managed Care – PPO | Admitting: Internal Medicine

## 2022-03-14 VITALS — BP 136/82 | HR 88 | Temp 97.3°F | Ht 64.0 in | Wt 166.0 lb

## 2022-03-14 DIAGNOSIS — Z0001 Encounter for general adult medical examination with abnormal findings: Secondary | ICD-10-CM | POA: Diagnosis not present

## 2022-03-14 DIAGNOSIS — Z1159 Encounter for screening for other viral diseases: Secondary | ICD-10-CM

## 2022-03-14 DIAGNOSIS — E663 Overweight: Secondary | ICD-10-CM | POA: Insufficient documentation

## 2022-03-14 DIAGNOSIS — F419 Anxiety disorder, unspecified: Secondary | ICD-10-CM | POA: Diagnosis not present

## 2022-03-14 DIAGNOSIS — F32A Depression, unspecified: Secondary | ICD-10-CM

## 2022-03-14 DIAGNOSIS — Z6825 Body mass index (BMI) 25.0-25.9, adult: Secondary | ICD-10-CM | POA: Insufficient documentation

## 2022-03-14 DIAGNOSIS — Z6828 Body mass index (BMI) 28.0-28.9, adult: Secondary | ICD-10-CM

## 2022-03-14 DIAGNOSIS — R519 Headache, unspecified: Secondary | ICD-10-CM

## 2022-03-14 DIAGNOSIS — Z114 Encounter for screening for human immunodeficiency virus [HIV]: Secondary | ICD-10-CM

## 2022-03-14 DIAGNOSIS — Z6826 Body mass index (BMI) 26.0-26.9, adult: Secondary | ICD-10-CM | POA: Insufficient documentation

## 2022-03-14 NOTE — Progress Notes (Signed)
? ?Subjective:  ? ? Patient ID: Emma Gaines, female    DOB: 2001-03-06, 21 y.o.   MRN: [id number] ? ?HPI ? ?Pt presents to the clinic today for her annual exam. She is requesting a note for an emotional support animal today due to her ongoing anxiety and depression. ? ?Flu: never ?Tetanus: 05/2021 ?Covid: never ?Pap smear: 05/2021 ?Dentist: biannually ? ?Diet: She does eat meat. She consumes more veggies than fruits. She does eat some fried foods. She drinks mostly water.  ?Exercise: Lift weights 3-4 times per week ? ?Review of Systems ? ?   ?Past Medical History:  ?Diagnosis Date  ? Frequent headaches   ? ? ?Current Outpatient Medications  ?Medication Sig Dispense Refill  ? albuterol (PROVENTIL HFA;VENTOLIN HFA) 108 (90 Base) MCG/ACT inhaler Inhale 2 puffs into the lungs every 6 (six) hours as needed for wheezing or shortness of breath. 1 Inhaler 0  ? naproxen (NAPROSYN) 500 MG tablet Take by mouth.    ? ondansetron (ZOFRAN) 4 MG tablet Take 1 tablet (4 mg total) by mouth every 8 (eight) hours as needed. 20 tablet 0  ? SPRINTEC 28 0.25-35 MG-MCG tablet TAKE 1 TABLET BY MOUTH ONCE DAILY. APPT NEEDED FOR PHYSICAL 84 tablet 0  ? ?No current facility-administered medications for this visit.  ? ? ?Allergies  ?Allergen Reactions  ? Other Anaphylaxis  ?  CORN DOG  ? ? ?Family History  ?Problem Relation Age of Onset  ? Lung cancer Maternal Grandmother   ? Alcohol abuse Maternal Grandfather   ? Colon cancer Paternal Uncle 22  ? ? ?Social History  ? ?Socioeconomic History  ? Marital status: Single  ?  Spouse name: Not on file  ? Number of children: Not on file  ? Years of education: Not on file  ? Highest education level: Not on file  ?Occupational History  ? Not on file  ?Tobacco Use  ? Smoking status: Never  ? Smokeless tobacco: Never  ?Vaping Use  ? Vaping Use: Never used  ?Substance and Sexual Activity  ? Alcohol use: Yes  ?  Alcohol/week: 0.0 standard drinks  ?  Comment: occasional   ? Drug use: No  ? Sexual  activity: Yes  ?Other Topics Concern  ? Not on file  ?Social History Narrative  ? Not on file  ? ?Social Determinants of Health  ? ?Financial Resource Strain: Not on file  ?Food Insecurity: Not on file  ?Transportation Needs: Not on file  ?Physical Activity: Not on file  ?Stress: Not on file  ?Social Connections: Not on file  ?Intimate Partner Violence: Not on file  ? ? ? ?Constitutional: Pt reports intermittent headaches. Denies fever, malaise, fatigue, or abrupt weight changes.  ?HEENT: Denies eye pain, eye redness, ear pain, ringing in the ears, wax buildup, runny nose, nasal congestion, bloody nose, or sore throat. ?Respiratory: Denies difficulty breathing, shortness of breath, cough or sputum production.   ?Cardiovascular: Denies chest pain, chest tightness, palpitations or swelling in the hands or feet.  ?Gastrointestinal: Denies abdominal pain, bloating, constipation, diarrhea or blood in the stool.  ?GU: Denies urgency, frequency, pain with urination, burning sensation, blood in urine, odor or discharge. ?Musculoskeletal: Denies decrease in range of motion, difficulty with gait, muscle pain or joint pain and swelling.  ?Skin: Denies redness, rashes, lesions or ulcercations.  ?Neurological: Denies dizziness, difficulty with memory, difficulty with speech or problems with balance and coordination.  ?Psych: Pt has a history of anxiety and depression. Denies SI/HI. ? ?  No other specific complaints in a complete review of systems (except as listed in HPI above). ? ?Objective:  ? Physical Exam ? ?BP 136/82 (BP Location: Left Arm, Patient Position: Sitting, Cuff Size: Normal)   Pulse 88   Temp (!) 97.3 ?F (36.3 ?C) (Temporal)   Ht 5\' 4"  (1.626 m)   Wt 166 lb (75.3 kg)   SpO2 98%   BMI 28.49 kg/m?  ? ?Wt Readings from Last 3 Encounters:  ?05/05/21 155 lb 6.4 oz (70.5 kg)  ?07/30/20 156 lb (70.8 kg)  ?03/26/20 145 lb (65.8 kg)  ? ? ?General: Appears their stated age, overweight, in NAD. ?Skin: Warm, dry and  intact.  ?HEENT: Head: normal shape and size; Eyes: sclera white, no icterus, conjunctiva pink, PERRLA and EOMs intact;  ?Neck:  Neck supple, trachea midline. No masses, lumps or thyromegaly present.  ?Cardiovascular: Normal rate and rhythm. S1,S2 noted.  No murmur, rubs or gallops noted. No JVD or BLE edema.  ?Pulmonary/Chest: Normal effort and positive vesicular breath sounds. No respiratory distress. No wheezes, rales or ronchi noted.  ?Abdomen: Soft and nontender. Normal bowel sounds. No distention or masses noted. ?Musculoskeletal: Strength 5/5 BUE/BLE. No difficulty with gait.  ?Neurological: Alert and oriented. Cranial nerves II-XII grossly intact. Coordination normal.  ?Psychiatric: Mood and affect normal. Behavior is normal. Judgment and thought content normal.  ? ? ? ?BMET ?   ?Component Value Date/Time  ? NA 139 01/03/2019 1606  ? K 4.4 01/03/2019 1606  ? CL 102 01/03/2019 1606  ? CO2 27 01/03/2019 1606  ? GLUCOSE 102 (H) 01/03/2019 1606  ? BUN 11 01/03/2019 1606  ? CREATININE 0.76 01/03/2019 1606  ? CALCIUM 9.6 01/03/2019 1606  ? ? ?Lipid Panel  ?   ?Component Value Date/Time  ? CHOL 149 10/10/2018 1536  ? TRIG 187.0 (H) 10/10/2018 1536  ? HDL 55.70 10/10/2018 1536  ? CHOLHDL 3 10/10/2018 1536  ? VLDL 37.4 10/10/2018 1536  ? LDLCALC 56 10/10/2018 1536  ? ? ?CBC ?   ?Component Value Date/Time  ? WBC 8.6 01/03/2019 1606  ? RBC 4.30 01/03/2019 1606  ? HGB 13.0 01/03/2019 1606  ? HCT 38.5 01/03/2019 1606  ? PLT 329.0 01/03/2019 1606  ? MCV 89.4 01/03/2019 1606  ? MCHC 33.7 01/03/2019 1606  ? RDW 12.7 01/03/2019 1606  ? ? ?Hgb A1C ?No results found for: HGBA1C ? ? ? ? ? ?   ?Assessment & Plan:  ? ?Preventative Health Maintneance: ? ?Encouraged her to get a flu shot in the fall ?Tetanus UTD ?She declines covid vaccine ?Pap smear UTD ?Encouraged her to consume a balanced diet and exercise regimen ?Advised her to see a dentist annually ?Will check CBC, CMET, Lipid, HIV and Hep C today ? ?RTC in 1 year, sooner if  needed ?01/05/2019, NP ? ?

## 2022-03-14 NOTE — Assessment & Plan Note (Signed)
Encouraged diet and exercise for weight loss ?

## 2022-03-14 NOTE — Patient Instructions (Signed)

## 2022-03-14 NOTE — Assessment & Plan Note (Signed)
Try to identify triggers and avoid them ?Ok to take Ibuprofen, Tylenol or Excedrin Migraine OTC as needed ?

## 2022-03-14 NOTE — Assessment & Plan Note (Signed)
Not medicated and not seeing a therapist ?Will write letter for emotional support animal ?Support offered ?

## 2022-03-15 LAB — LIPID PANEL
Cholesterol: 168 mg/dL (ref <200)
HDL: 64 mg/dL (ref >=50)
LDL Cholesterol (Calc): 89 mg/dL (calc)
Non-HDL Cholesterol (Calc): 104 mg/dL (calc) (ref <130)
Total CHOL/HDL Ratio: 2.6 (calc) (ref <5.0)
Triglycerides: 63 mg/dL (ref <150)

## 2022-03-15 LAB — COMPLETE METABOLIC PANEL WITH GFR
AG Ratio: 1.6 (calc) (ref 1.0–2.5)
ALT: 14 U/L (ref 6–29)
AST: 14 U/L (ref 10–30)
Albumin: 4.6 g/dL (ref 3.6–5.1)
Alkaline phosphatase (APISO): 62 U/L (ref 31–125)
BUN: 14 mg/dL (ref 7–25)
CO2: 28 mmol/L (ref 20–32)
Calcium: 9.7 mg/dL (ref 8.6–10.2)
Chloride: 104 mmol/L (ref 98–110)
Creat: 0.67 mg/dL (ref 0.50–0.96)
Globulin: 2.8 g/dL (calc) (ref 1.9–3.7)
Glucose, Bld: 95 mg/dL (ref 65–99)
Potassium: 4.3 mmol/L (ref 3.5–5.3)
Sodium: 141 mmol/L (ref 135–146)
Total Bilirubin: 0.6 mg/dL (ref 0.2–1.2)
Total Protein: 7.4 g/dL (ref 6.1–8.1)
eGFR: 127 mL/min/{1.73_m2} (ref >=60)

## 2022-03-15 LAB — CBC
HCT: 42.1 % (ref 35.0–45.0)
Hemoglobin: 13.9 g/dL (ref 11.7–15.5)
MCH: 29.8 pg (ref 27.0–33.0)
MCHC: 33 g/dL (ref 32.0–36.0)
MCV: 90.3 fL (ref 80.0–100.0)
MPV: 10.7 fL (ref 7.5–12.5)
Platelets: 267 10*3/uL (ref 140–400)
RBC: 4.66 10*6/uL (ref 3.80–5.10)
RDW: 12.1 % (ref 11.0–15.0)
WBC: 6.2 10*3/uL (ref 3.8–10.8)

## 2022-03-15 LAB — HEPATITIS C ANTIBODY
Hepatitis C Ab: NONREACTIVE
SIGNAL TO CUT-OFF: 0.09 (ref <1.00)

## 2022-03-15 LAB — HIV ANTIBODY (ROUTINE TESTING W REFLEX): HIV 1&2 Ab, 4th Generation: NONREACTIVE

## 2022-03-17 ENCOUNTER — Encounter: Payer: Self-pay | Admitting: Internal Medicine

## 2022-06-29 ENCOUNTER — Encounter: Payer: Self-pay | Admitting: Internal Medicine

## 2022-08-24 ENCOUNTER — Ambulatory Visit
Admission: EM | Admit: 2022-08-24 | Discharge: 2022-08-24 | Disposition: A | Discharge status: Home / Self Care | Payer: BC Managed Care – PPO | Attending: Physician Assistant | Admitting: Physician Assistant

## 2022-08-24 ENCOUNTER — Encounter: Payer: Self-pay | Admitting: Internal Medicine

## 2022-08-24 ENCOUNTER — Encounter: Payer: Self-pay | Admitting: Emergency Medicine

## 2022-08-24 DIAGNOSIS — G43109 Migraine with aura, not intractable, without status migrainosus: Secondary | ICD-10-CM

## 2022-08-24 MED ORDER — KETOROLAC TROMETHAMINE 60 MG/2ML IM SOLN
60.0000 mg | Freq: Once | INTRAMUSCULAR | Status: AC
  Administered 2022-08-24: 60 mg via INTRAMUSCULAR

## 2022-08-24 NOTE — Discharge Instructions (Addendum)
-  Presentation today is consistent with migraine with aura. - We have given you an injection of Toradol in the clinic to help with this.  Go home and rest and increase your fluids. - If you feel that your headache worsens or you have loss of vision, feel like you are going to pass out, have numbness/tingling or weakness in your extremities or face or any other acute worsening of symptoms, please go to emergency department for further evaluation but I do not think it is necessary at this time.  There are no red flags on your exam or with your presentation.  Exam is overall very reassuring.

## 2022-08-24 NOTE — ED Triage Notes (Signed)
Pt presents with dizziness, vision changes (seeing spots), HA, and reports elevated BP started today.

## 2022-08-24 NOTE — ED Provider Notes (Signed)
[facility]    CSN: [id number] Arrival date & time: 08/24/22  1341      History   Chief Complaint Chief Complaint  Patient presents with   Headache   Hypertension   Blurred Vision    HPI Annelyse Harlin HeysMichelle Peterka is a 21 y.o. female presenting with her mother today for onset of vision changes this morning at about 8 AM, about 5 to 6 hours ago.  Patient reports she was at school when she started seeing black spots in her vision.  She reports she started to feel dizzy at that time as well.  Patient notes that she had a right-sided headache start at about noon, 2 to 3 hours ago.  The vision changes have been in and out and she is not experiencing the change at this time.  Denies vision loss or eye pain.  Denies photophobia but says she would "rather the light not beyond."  Denies phonophobia.  Denies any numbness, tingling or weakness of face or extremities.  Gait disturbances or speech problems.  She does report an elevated blood pressure in the 160s systolic several hours ago.  Says she was not having the headache yet at that time.  Patient's mother is concerned she may be having a TIA.  She has no history of this.  No history of clotting disorders.  Does not take contraceptives.  Medical history is significant for frequent headaches.  Reports she has had migraines in the past but this feels little different.  Other history significant for anxiety and depression.  She has not taken anything for her current headache.  She says she normally takes naproxen or Tylenol but since her symptoms seem different and she thought she needed to be seen immediately.  She has not had any nausea or vomiting.  No injuries to head or falls.  No other complaints.  HPI  Past Medical History:  Diagnosis Date   Frequent headaches     Patient Active Problem List   Diagnosis Date Noted   Overweight with body mass index (BMI) of 28 to 28.9 in adult 03/14/2022   Anxiety and depression 09/21/2017    Frequent headaches 05/19/2015    Past Surgical History:  Procedure Laterality Date   COSMETIC SURGERY     upper lip    OB History   No obstetric history on file.      Home Medications    Prior to Admission medications   Medication Sig Start Date End Date Taking? Authorizing Provider  albuterol (PROVENTIL HFA;VENTOLIN HFA) 108 (90 Base) MCG/ACT inhaler Inhale 2 puffs into the lungs every 6 (six) hours as needed for wheezing or shortness of breath. 01/19/18   Lorre MunroeBaity, Regina W, NP  naproxen (NAPROSYN) 500 MG tablet Take by mouth. 07/20/17   [provider]  ondansetron (ZOFRAN) 4 MG tablet Take 1 tablet (4 mg total) by mouth every 8 (eight) hours as needed. 12/25/17   Lorre MunroeBaity, Regina W, NP    Family History Family History  Problem Relation Age of Onset   Lung cancer Maternal Grandmother    Alcohol abuse Maternal Grandfather    Colon cancer Paternal Uncle [phone]   Breast cancer Paternal Aunt     Social History Social History   Tobacco Use   Smoking status: Never   Smokeless tobacco: Never  Vaping Use   Vaping Use: Never used  Substance Use Topics   Alcohol use: Yes    Alcohol/week: 0.0 standard drinks of alcohol    Comment:  occasional    Drug use: No     Allergies   Other   Review of Systems Review of Systems  Constitutional:  Negative for fatigue.  HENT:  Negative for congestion.   Eyes:  Positive for visual disturbance. Negative for photophobia and pain.  Respiratory:  Negative for cough.   Gastrointestinal:  Negative for nausea and vomiting.  Musculoskeletal:  Negative for myalgias, neck pain and neck stiffness.  Neurological:  Positive for dizziness and headaches. Negative for syncope, facial asymmetry, speech difficulty, weakness, light-headedness and numbness.  Psychiatric/Behavioral:  Negative for confusion.      Physical Exam Triage Vital Signs ED Triage Vitals  Enc Vitals Group     BP 08/24/22 1353 126/73     Pulse Rate 08/24/22 1353 79      Resp --      Temp 08/24/22 1353 98.2 F (36.8 C)     Temp Source 08/24/22 1353 Oral     SpO2 08/24/22 1353 100 %     Weight --      Height --      Head Circumference --      Peak Flow --      Pain Score 08/24/22 1351 4     Pain Loc --      Pain Edu? --      Excl. in GC? --    No data found.  Updated Vital Signs BP 126/73 (BP Location: Left Arm)   Pulse 79   Temp 98.2 F (36.8 C) (Oral)   LMP 08/09/2022   SpO2 100%   Visual Acuity Right Eye Distance:   Left Eye Distance:   Bilateral Distance:    Physical Exam Vitals and nursing note reviewed.  Constitutional:      General: She is not in acute distress.    Appearance: Normal appearance. She is well-developed. She is not ill-appearing or toxic-appearing.  HENT:     Head: Normocephalic and atraumatic.     Nose: Nose normal.     Mouth/Throat:     Mouth: Mucous membranes are moist.     Pharynx: Oropharynx is clear.  Eyes:     General: No scleral icterus.       Right eye: No discharge.        Left eye: No discharge.     Extraocular Movements: Extraocular movements intact.     Conjunctiva/sclera: Conjunctivae normal.     Pupils: Pupils are equal, round, and reactive to light.  Cardiovascular:     Rate and Rhythm: Normal rate and regular rhythm.     Heart sounds: Normal heart sounds.  Pulmonary:     Effort: Pulmonary effort is normal. No respiratory distress.     Breath sounds: Normal breath sounds.  Musculoskeletal:     Cervical back: Neck supple.  Skin:    General: Skin is dry.  Neurological:     General: No focal deficit present.     Mental Status: She is alert and oriented to person, place, and time. Mental status is at baseline.     GCS: GCS eye subscore is 4. GCS verbal subscore is 5. GCS motor subscore is 6.     Cranial Nerves: No cranial nerve deficit.     Motor: No weakness.     Coordination: Coordination normal.     Gait: Gait normal.     Comments: 5/5 strength bilateral upper and lower extremities   Psychiatric:        Mood and Affect: Mood normal.  Behavior: Behavior normal.        Thought Content: Thought content normal.      UC Treatments / Results  Labs (all labs ordered are listed, but only abnormal results are displayed) Labs Reviewed - No data to display  EKG   Radiology No results found.  Procedures Procedures (including critical care time)  Medications Ordered in UC Medications  ketorolac (TORADOL) injection 60 mg (60 mg Intramuscular Given 08/24/22 1442)    Initial Impression / Assessment and Plan / UC Course  I have reviewed the triage vital signs and the nursing notes.  Pertinent labs & imaging results that were available during my care of the patient were reviewed by me and considered in my medical decision making (see chart for details).   21 year old female presents with mother for right-sided headache that started a couple of hours ago.  Couple of hours before that she started experiencing black spots in her vision and dizziness.  Patient has a history of migraines but this is not like her typical migraine.  She says she has never had vision changes before.  She is denying any red flag signs or symptoms.  Denies severe headache, loss of vision, numbness/weakness/tingling of face or extremities, gait problems or speech difficulty, chest pain, breathing difficulty, feeling faint or passing out has not yet treated condition.  Vitals are all normal and stable.  She is overall well-appearing.  Exam is overall reassuring.  Normal cranial nerve exam.  Pupils equal round and reactive to light.  She is alert and oriented x3.  GCS is 15.  Chest clear to auscultation heart regular rate and rhythm.  Advised patient her symptoms are consistent with migraine with aura.  Her mother says she does not believe that is what is going on and is more concerned.  I discussed with patient the option to treat her with Toradol and possibly promethazine or meclizine and have her  go home and rest and monitor symptoms, go to emergency department if symptoms acutely worsen or she develops any red flag signs or symptoms.  Other option would be to go to the emergency department at this time where they may evaluate her and consider doing CT scan but I advised that will likely not happen since her symptoms are so classic glee related to migraine with aura.  Patient declined to go to emergency department at this time elects to receive the Toradol injection.  60 mg IM ketorolac given.  Declines promethazine or meclizine.  I encouraged her to go home and rest increase fluids and thoroughly reviewed ED precautions/red flag signs.  Note given for missing school.  Follow-up here as needed. If no improvement in symptoms should follow up with PCP or neuro to have head CT considered given change in migraines.    Final Clinical Impressions(s) / UC Diagnoses   Final diagnoses:  Migraine with aura and without status migrainosus, not intractable     Discharge Instructions      -Presentation today is consistent with migraine with aura. - We have given you an injection of Toradol in the clinic to help with this.  Go home and rest and increase your fluids. - If you feel that your headache worsens or you have loss of vision, feel like you are going to pass out, have numbness/tingling or weakness in your extremities or face or any other acute worsening of symptoms, please go to emergency department for further evaluation but I do not think it is necessary at this  time.  There are no red flags on your exam or with your presentation.  Exam is overall very reassuring.     ED Prescriptions   None    PDMP not reviewed this encounter.   Shirlee Latch, PA-C 08/24/22 1458

## 2022-10-04 ENCOUNTER — Encounter: Payer: Self-pay | Admitting: Internal Medicine

## 2022-10-18 ENCOUNTER — Encounter: Payer: Self-pay | Admitting: Internal Medicine

## 2022-10-18 ENCOUNTER — Telehealth: Payer: Self-pay | Admitting: Internal Medicine

## 2022-10-18 NOTE — Telephone Encounter (Signed)
Patient mother would like to pick up form on Friday at her 8am appointment. Caller states patient sent a my chart message on 10/31 and 11/14 and no response. Caller would like follow up call to confirm form will be ready on Friday 11/17

## 2022-10-18 NOTE — Telephone Encounter (Signed)
I have this ready for her.

## 2022-10-24 ENCOUNTER — Ambulatory Visit (INDEPENDENT_AMBULATORY_CARE_PROVIDER_SITE_OTHER): Payer: BC Managed Care – PPO

## 2022-10-24 DIAGNOSIS — Z111 Encounter for screening for respiratory tuberculosis: Secondary | ICD-10-CM | POA: Diagnosis not present

## 2022-10-26 LAB — TB SKIN TEST
Induration: 0 mm
TB Skin Test: NEGATIVE

## 2022-11-25 ENCOUNTER — Ambulatory Visit: Payer: BC Managed Care – PPO | Admitting: Internal Medicine

## 2022-11-25 ENCOUNTER — Encounter: Payer: Self-pay | Admitting: Internal Medicine

## 2022-11-25 VITALS — BP 124/72 | HR 88 | Temp 97.1°F | Wt 177.0 lb

## 2022-11-25 DIAGNOSIS — J00 Acute nasopharyngitis [common cold]: Secondary | ICD-10-CM

## 2022-11-25 MED ORDER — AMOXICILLIN-POT CLAVULANATE 875-125 MG PO TABS: 1.0000 | ORAL_TABLET | Freq: Two times a day (BID) | ORAL | 0 refills | Status: DC

## 2022-11-25 NOTE — Progress Notes (Signed)
Subjective:    Patient ID: Emma Gaines, female    DOB: 09/18/2001, 21 y.o.   MRN: [id number]  HPI  Patient presents to clinic today with complaint of sinus pressure, runny nose, nasal congestion, left ear pain, cough and shortness of breath.  This started started 1 month ago. She is blowing yellow/green mucous out of her nose.  The cough is mostly nonproductive.  She denies sore throat, chest pain, nausea, vomiting or diarrhea.  She denies fever, chills or body aches.  She has tied Dayquil and Nyquil OTC with minimal relief of symptoms.  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  She did not take a COVID test.  Review of Systems     Past Medical History:  Diagnosis Date   Frequent headaches     Current Outpatient Medications  Medication Sig Dispense Refill   albuterol (PROVENTIL HFA;VENTOLIN HFA) 108 (90 Base) MCG/ACT inhaler Inhale 2 puffs into the lungs every 6 (six) hours as needed for wheezing or shortness of breath. 1 Inhaler 0   naproxen (NAPROSYN) 500 MG tablet Take by mouth.     ondansetron (ZOFRAN) 4 MG tablet Take 1 tablet (4 mg total) by mouth every 8 (eight) hours as needed. 20 tablet 0   No current facility-administered medications for this visit.    Allergies  Allergen Reactions   Other Anaphylaxis    CORN DOG    Family History  Problem Relation Age of Onset   Lung cancer Maternal Grandmother    Alcohol abuse Maternal Grandfather    Colon cancer Paternal Uncle 22   Breast cancer Paternal Aunt     Social History   Socioeconomic History   Marital status: Single    Spouse name: Not on file   Number of children: Not on file   Years of education: Not on file   Highest education level: Not on file  Occupational History   Not on file  Tobacco Use   Smoking status: Never   Smokeless tobacco: Never  Vaping Use   Vaping Use: Never used  Substance and Sexual Activity   Alcohol use: Yes    Alcohol/week:  0.0 standard drinks of alcohol    Comment: occasional    Drug use: No   Sexual activity: Yes  Other Topics Concern   Not on file  Social History Narrative   Not on file   Social Determinants of Health   Financial Resource Strain: Not on file  Food Insecurity: Not on file  Transportation Needs: Not on file  Physical Activity: Not on file  Stress: Not on file  Social Connections: Not on file  Intimate Partner Violence: Not on file     Constitutional: Denies fever, malaise, fatigue, headache or abrupt weight changes.  HEENT: Patient reports sinus pressure, runny nose, nasal congestion, left ear pain.  Denies eye pain, eye redness, ringing in the ears, wax buildup, bloody nose, or sore throat. Respiratory: Patient reports cough and shortness of breath.  Denies difficulty breathing, or sputum production.   Cardiovascular: Denies chest pain, chest tightness, palpitations or swelling in the hands or feet.  Gastrointestinal: Denies abdominal pain, bloating, constipation, diarrhea or blood in the stool.   No other specific complaints in a complete review of systems (except as listed in HPI above).  Objective:   Physical Exam  BP 124/72 (BP Location: Left Arm, Patient Position: Sitting, Cuff Size: Normal)   Pulse 88  Temp (!) 97.1 F (36.2 C) (Temporal)   Wt 177 lb (80.3 kg)   SpO2 99%   BMI 30.38 kg/m   Wt Readings from Last 3 Encounters:  03/14/22 166 lb (75.3 kg)  05/05/21 155 lb 6.4 oz (70.5 kg)  07/30/20 156 lb (70.8 kg)    General: Appears her stated age, obese, in NAD. Skin: Warm, dry and intact. No rashes noted. HEENT: Head: normal shape and size, no sinus tenderness noted; Eyes: sclera white, no icterus, conjunctiva pink, PERRLA and EOMs intact; Ears: Tm's gray and intact, normal light reflex; Throat/Mouth: Teeth present, mucosa pink and moist, + PND, no exudate, lesions or ulcerations noted.  Neck: No adenopathy noted. Cardiovascular: Normal rate and rhythm. S1,S2  noted.  No murmur, rubs or gallops noted.  Pulmonary/Chest: Normal effort and positive vesicular breath sounds. No respiratory distress. No wheezes, rales or ronchi noted.  Neurological: Alert and oriented.   BMET    Component Value Date/Time   NA 141 03/14/2022 0921   K 4.3 03/14/2022 0921   CL 104 03/14/2022 0921   CO2 28 03/14/2022 0921   GLUCOSE 95 03/14/2022 0921   BUN 14 03/14/2022 0921   CREATININE 0.67 03/14/2022 0921   CALCIUM 9.7 03/14/2022 0921    Lipid Panel     Component Value Date/Time   CHOL 168 03/14/2022 0921   TRIG 63 03/14/2022 0921   HDL 64 03/14/2022 0921   CHOLHDL 2.6 03/14/2022 0921   VLDL 37.4 10/10/2018 1536   LDLCALC 89 03/14/2022 0921    CBC    Component Value Date/Time   WBC 6.2 03/14/2022 0921   RBC 4.66 03/14/2022 0921   HGB 13.9 03/14/2022 0921   HCT 42.1 03/14/2022 0921   PLT 267 03/14/2022 0921   MCV 90.3 03/14/2022 0921   MCH 29.8 03/14/2022 0921   MCHC 33.0 03/14/2022 0921   RDW 12.1 03/14/2022 0921    Hgb A1C No results found for: "HGBA1C"         Assessment & Plan:  Upper Respiratory Infection:  Encourage rest and fluids Start daily antihistamine x 2 weeks Rx for Augmentin 875-125 mg twice daily x 10 days  RTC in 4 months for your annual exam Nicki Reaper, NP

## 2022-11-25 NOTE — Patient Instructions (Signed)

## 2022-12-02 ENCOUNTER — Encounter: Payer: Self-pay | Admitting: Internal Medicine

## 2022-12-02 MED ORDER — PREDNISONE 10 MG PO TABS: ORAL_TABLET | ORAL | 0 refills | Status: DC

## 2023-01-31 ENCOUNTER — Encounter: Payer: Self-pay | Admitting: Internal Medicine

## 2023-04-06 ENCOUNTER — Ambulatory Visit (INDEPENDENT_AMBULATORY_CARE_PROVIDER_SITE_OTHER): Payer: BC Managed Care – PPO | Admitting: Internal Medicine

## 2023-04-06 ENCOUNTER — Encounter: Payer: Self-pay | Admitting: Internal Medicine

## 2023-04-06 VITALS — BP 110/78 | HR 80 | Ht 64.0 in | Wt 160.0 lb

## 2023-04-06 DIAGNOSIS — J029 Acute pharyngitis, unspecified: Secondary | ICD-10-CM | POA: Diagnosis not present

## 2023-04-06 DIAGNOSIS — R051 Acute cough: Secondary | ICD-10-CM

## 2023-04-06 MED ORDER — ALBUTEROL SULFATE HFA 108 (90 BASE) MCG/ACT IN AERS: 2.0000 | INHALATION_SPRAY | Freq: Four times a day (QID) | RESPIRATORY_TRACT | 0 refills | Status: AC | PRN

## 2023-04-06 MED ORDER — METHYLPREDNISOLONE ACETATE 80 MG/ML IJ SUSP
80.0000 mg | Freq: Once | INTRAMUSCULAR | Status: AC
  Administered 2023-04-06: 80 mg via INTRAMUSCULAR

## 2023-04-06 NOTE — Patient Instructions (Signed)

## 2023-04-06 NOTE — Progress Notes (Signed)
HPI  Pt presents to the clinic today with c/o sore throat, cough and chest tightness. This started 2 days ago. She denies difficulty swallowing. The cough is mostly non prodcutive. She denies shortness of breath or chest pain. She denies headache, runny nose, nasal congestion, ear pain, nausea, vomiting or diarrhea.  She denies fever, chills or body aches. She has tried Benadryl and Albuterol as needed with minimal relief of symptoms.  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   Review of Systems      Past Medical History:  Diagnosis Date   Frequent headaches     Family History  Problem Relation Age of Onset   Lung cancer Maternal Grandmother    Alcohol abuse Maternal Grandfather    Colon cancer Paternal Uncle 35   Breast cancer Paternal Aunt     Social History   Socioeconomic History   Marital status: Single    Spouse name: Not on file   Number of children: Not on file   Years of education: Not on file   Highest education level: Not on file  Occupational History   Not on file  Tobacco Use   Smoking status: Never   Smokeless tobacco: Never  Vaping Use   Vaping Use: Never used  Substance and Sexual Activity   Alcohol use: Yes    Alcohol/week: 0.0 standard drinks of alcohol    Comment: occasional    Drug use: No   Sexual activity: Yes  Other Topics Concern   Not on file  Social History Narrative   Not on file   Social Determinants of Health   Financial Resource Strain: Not on file  Food Insecurity: Not on file  Transportation Needs: Not on file  Physical Activity: Not on file  Stress: Not on file  Social Connections: Not on file  Intimate Partner Violence: Not on file    Allergies  Allergen Reactions   Other Anaphylaxis    CORN DOG     Constitutional: Denies headache, fatigue, fever or abrupt weight changes.  HEENT:  Positive sore throat. Denies eye redness, eye pain, pressure behind the eyes, facial pain, nasal  congestion, ear pain, ringing in the ears, wax buildup, runny nose or bloody nose. Respiratory: Positive cough. Denies difficulty breathing or shortness of breath.  Cardiovascular: Denies chest pain, chest tightness, palpitations or swelling in the hands or feet.   No other specific complaints in a complete review of systems (except as listed in HPI above).  Objective:   BP 110/78   Pulse 80   Ht 5\' 4"  (1.626 m)   Wt 160 lb (72.6 kg)   SpO2 97%   BMI 27.46 kg/m   Wt Readings from Last 3 Encounters:  11/25/22 177 lb (80.3 kg)  03/14/22 166 lb (75.3 kg)  05/05/21 155 lb 6.4 oz (70.5 kg)     General: Appears her stated age, overweight, in NAD. HEENT: Head: normal shape and size, no sinus tenderness noted; Eyes: sclera white, no icterus, conjunctiva pink; Throat/Mouth: + PND. Teeth present, mucosa erythematous and moist, no exudate noted, no lesions or ulcerations noted.  Neck: No cervical lymphadenopathy.  Cardiovascular: Normal rate and rhythm. S1,S2 noted.  No murmur, rubs or gallops noted.  Pulmonary/Chest: Normal effort and positive vesicular breath sounds. No respiratory distress. No wheezes, rales or ronchi noted.       Assessment & Plan:   Sore Throat, Cough:  Allergy versus viral related  No indication for strep, flu or COVID testing 80 mg Depo-Medrol IM x 1 Get some rest and drink plenty of water Do salt water gargles for the sore throat Start Zyrtec daily x 2 weeks  Schedule an appointment for your annual exam   Nicki Reaper, NP

## 2023-04-06 NOTE — Addendum Note (Signed)
Addended by: Judd Gaudier on: 04/06/2023 11:12 AM   Modules accepted: Orders

## 2023-05-25 ENCOUNTER — Encounter: Payer: Self-pay | Admitting: Internal Medicine

## 2023-05-30 ENCOUNTER — Encounter: Payer: Self-pay | Admitting: Internal Medicine

## 2023-06-06 ENCOUNTER — Encounter: Payer: Self-pay | Admitting: Internal Medicine

## 2023-06-06 ENCOUNTER — Ambulatory Visit (INDEPENDENT_AMBULATORY_CARE_PROVIDER_SITE_OTHER): Payer: BC Managed Care – PPO | Admitting: Internal Medicine

## 2023-06-06 VITALS — BP 122/80 | HR 68 | Temp 96.6°F | Ht 64.0 in | Wt 156.0 lb

## 2023-06-06 DIAGNOSIS — Z0001 Encounter for general adult medical examination with abnormal findings: Secondary | ICD-10-CM

## 2023-06-06 DIAGNOSIS — Z6825 Body mass index (BMI) 25.0-25.9, adult: Secondary | ICD-10-CM

## 2023-06-06 DIAGNOSIS — E663 Overweight: Secondary | ICD-10-CM | POA: Diagnosis not present

## 2023-06-06 DIAGNOSIS — R7309 Other abnormal glucose: Secondary | ICD-10-CM

## 2023-06-06 NOTE — Patient Instructions (Signed)

## 2023-06-06 NOTE — Progress Notes (Signed)
Subjective:    Patient ID: Emma Gaines, female    DOB: 2001-10-01, 22 y.o.   MRN: [id number]  HPI  Patient presents to the clinic today for her annual exam.  She is also due to follow-up chronic conditions.  Anxiety and depression: Chronic, but she is not currently taking any medications for this.  She is not currently seeing a therapist.  She denies SI/HI.  Frequent headaches: Triggered by stress.  These occur a few times per month.  She takes as needed with good relief of symptoms.  She does not follow with neurology.  Flu: Never Tetanus: 05/2021 COVID: Never Pap smear: 05/2021 Dentist: biannually  Diet: She does eat meat. She consumes fruits and veggies. She does eat some fried foods. She drinks mostly water, hot tea. Exercise: Lifting weights   Review of Systems     Past Medical History:  Diagnosis Date   Frequent headaches     Current Outpatient Medications  Medication Sig Dispense Refill   albuterol (VENTOLIN HFA) 108 (90 Base) MCG/ACT inhaler Inhale 2 puffs into the lungs every 6 (six) hours as needed for wheezing or shortness of breath. 1 each 0   No current facility-administered medications for this visit.    Allergies  Allergen Reactions   Other Anaphylaxis    CORN DOG    Family History  Problem Relation Age of Onset   Lung cancer Maternal Grandmother    Alcohol abuse Maternal Grandfather    Colon cancer Paternal Uncle 39   Breast cancer Paternal Aunt     Social History   Socioeconomic History   Marital status: Single    Spouse name: Not on file   Number of children: Not on file   Years of education: Not on file   Highest education level: Not on file  Occupational History   Not on file  Tobacco Use   Smoking status: Never   Smokeless tobacco: Never  Vaping Use   Vaping Use: Never used  Substance and Sexual Activity   Alcohol use: Yes    Alcohol/week: 0.0 standard drinks of alcohol    Comment: occasional    Drug use: No    Sexual activity: Yes  Other Topics Concern   Not on file  Social History Narrative   Not on file   Social Determinants of Health   Financial Resource Strain: Not on file  Food Insecurity: Not on file  Transportation Needs: Not on file  Physical Activity: Not on file  Stress: Not on file  Social Connections: Not on file  Intimate Partner Violence: Not on file     Constitutional: Patient reports intermittent headaches.  Denies fever, malaise, fatigue, or abrupt weight changes.  HEENT: Denies eye pain, eye redness, ear pain, ringing in the ears, wax buildup, runny nose, nasal congestion, bloody nose, or sore throat. Respiratory: Denies difficulty breathing, shortness of breath, cough or sputum production.   Cardiovascular: Denies chest pain, chest tightness, palpitations or swelling in the hands or feet.  Gastrointestinal: Denies abdominal pain, bloating, constipation, diarrhea or blood in the stool.  GU: Denies urgency, frequency, pain with urination, burning sensation, blood in urine, odor or discharge. Musculoskeletal: Denies decrease in range of motion, difficulty with gait, muscle pain or joint pain and swelling.  Skin: Denies redness, rashes, lesions or ulcercations.  Neurological: Denies dizziness, difficulty with memory, difficulty with speech or problems with balance and coordination.  Psych: Patient has a history of anxiety and depression.  Denies SI/HI.  No  other specific complaints in a complete review of systems (except as listed in HPI above).  Objective:   Physical Exam   BP 122/80 (BP Location: Left Arm, Patient Position: Sitting, Cuff Size: Normal)   Pulse 68   Temp (!) 96.6 F (35.9 C) (Temporal)   Ht 5\' 4"  (1.626 m)   Wt 156 lb (70.8 kg)   SpO2 99%   BMI 26.78 kg/m   Wt Readings from Last 3 Encounters:  04/06/23 160 lb (72.6 kg)  11/25/22 177 lb (80.3 kg)  03/14/22 166 lb (75.3 kg)    General: Appears her stated age, overweight in NAD. Skin: Warm,  dry and intact. HEENT: Head: normal shape and size; Eyes: sclera white, no icterus, conjunctiva pink, PERRLA and EOMs intact;  Neck:  Neck supple, trachea midline. No masses, lumps or thyromegaly present.  Cardiovascular: Normal rate and rhythm. S1,S2 noted.  No murmur, rubs or gallops noted. No JVD or BLE edema.  Pulmonary/Chest: Normal effort and positive vesicular breath sounds. No respiratory distress. No wheezes, rales or ronchi noted.  Abdomen: Normal bowel sounds.  Musculoskeletal: Strength 5/5BUE/BLE. No difficulty with gait.  Neurological: Alert and oriented. Cranial nerves II-XII grossly intact. Coordination normal.  Psychiatric: Mood and affect normal. Behavior is normal. Judgment and thought content normal.    BMET    Component Value Date/Time   NA 141 03/14/2022 0921   K 4.3 03/14/2022 0921   CL 104 03/14/2022 0921   CO2 28 03/14/2022 0921   GLUCOSE 95 03/14/2022 0921   BUN 14 03/14/2022 0921   CREATININE 0.67 03/14/2022 0921   CALCIUM 9.7 03/14/2022 0921    Lipid Panel     Component Value Date/Time   CHOL 168 03/14/2022 0921   TRIG 63 03/14/2022 0921   HDL 64 03/14/2022 0921   CHOLHDL 2.6 03/14/2022 0921   VLDL 37.4 10/10/2018 1536   LDLCALC 89 03/14/2022 0921    CBC    Component Value Date/Time   WBC 6.2 03/14/2022 0921   RBC 4.66 03/14/2022 0921   HGB 13.9 03/14/2022 0921   HCT 42.1 03/14/2022 0921   PLT 267 03/14/2022 0921   MCV 90.3 03/14/2022 0921   MCH 29.8 03/14/2022 0921   MCHC 33.0 03/14/2022 0921   RDW 12.1 03/14/2022 0921    Hgb A1C No results found for: "HGBA1C"         Assessment & Plan:   Preventative health maintenance:  Encouraged her to get a flu shot in the fall Tetanus UTD Encouraged her to get her COVID-vaccine Pap smear UTD Encouraged her to consume a balanced diet and exercise regimen Advised her to see a dentist annually She declines labs today  RTC in 1 year, sooner if needed Nicki Reaper, NP

## 2023-06-06 NOTE — Assessment & Plan Note (Signed)
Encouraged diet and exercise for weight loss ?

## 2023-06-20 ENCOUNTER — Ambulatory Visit: Payer: Self-pay | Admitting: *Deleted

## 2023-06-20 NOTE — Telephone Encounter (Signed)
Message from Ranson M sent at 06/20/2023  7:55 AM EDT  Summary: eye pain   Pt mother stated pt is experiencing eye pain asked which eye and if the pain was severe and if there were any other symptoms. Mom stated that she did not tell me. Pt is scheduled to be available with PCP tomorrow, 07/17. Mom asked if pt can be worked on today.  Seeking clinical advice.  Please call pt directly.        Called pt on mobile number and LM on Vm to call back.

## 2023-06-20 NOTE — Telephone Encounter (Signed)
Third attempt to reach pt, eft VM each attempt.Routing to practice for PCPs resolution per protocol.

## 2023-06-20 NOTE — Telephone Encounter (Signed)
Summary: eye pain   Pt mother stated pt is experiencing eye pain asked which eye and if the pain was severe and if there were any other symptoms. Mom stated that she did not tell me. Pt is scheduled to be available with PCP tomorrow, 07/17. Mom asked if pt can be worked on today.  Seeking clinical advice.  Please call pt directly.

## 2023-06-21 ENCOUNTER — Ambulatory Visit: Payer: BC Managed Care – PPO | Admitting: Internal Medicine

## 2023-10-28 ENCOUNTER — Telehealth: Payer: BC Managed Care – PPO | Admitting: Family Medicine

## 2023-10-28 DIAGNOSIS — J019 Acute sinusitis, unspecified: Secondary | ICD-10-CM | POA: Diagnosis not present

## 2023-10-28 MED ORDER — PREDNISONE 10 MG (21) PO TBPK
ORAL_TABLET | ORAL | 0 refills | Status: DC
Start: 2023-10-28 — End: 2023-11-13

## 2023-10-28 MED ORDER — DOXYCYCLINE HYCLATE 100 MG PO TABS
100.0000 mg | ORAL_TABLET | Freq: Two times a day (BID) | ORAL | 0 refills | Status: AC
Start: 2023-10-28 — End: 2023-11-04

## 2023-10-28 NOTE — Patient Instructions (Signed)
Louie Casa, thank you for joining Reed Pandy, PA-C for today's virtual visit.  While this provider is not your primary care provider (PCP), if your PCP is located in our provider database this encounter information will be shared with them immediately following your visit.   A [facility] MyChart account gives you access to today's visit and all your visits, tests, and labs performed at [facility] " click here if you don't have a [facility] MyChart account or go to [email or web address]  Consent: (Patient) Lurie Cetrone provided verbal consent for this virtual visit at the beginning of the encounter.  Current Medications:  Current Outpatient Medications:    doxycycline (VIBRA-TABS) 100 MG tablet, Take 1 tablet (100 mg total) by mouth 2 (two) times daily for 7 days., Disp: 14 tablet, Rfl: 0   predniSONE (STERAPRED UNI-PAK 21 TAB) 10 MG (21) TBPK tablet, Take following package directions., Disp: 21 tablet, Rfl: 0   albuterol (VENTOLIN HFA) 108 (90 Base) MCG/ACT inhaler, Inhale 2 puffs into the lungs every 6 (six) hours as needed for wheezing or shortness of breath., Disp: 1 each, Rfl: 0   Medications ordered in this encounter:  Meds ordered this encounter  Medications   doxycycline (VIBRA-TABS) 100 MG tablet    Sig: Take 1 tablet (100 mg total) by mouth 2 (two) times daily for 7 days.    Dispense:  14 tablet    Refill:  0   predniSONE (STERAPRED UNI-PAK 21 TAB) 10 MG (21) TBPK tablet    Sig: Take following package directions.    Dispense:  21 tablet    Refill:  0    Please dispense one standard blister pack taper.     *If you need refills on other medications prior to your next appointment, please contact your pharmacy*  Follow-Up: Call back or seek an in-person evaluation if the symptoms worsen or if the condition fails to improve as anticipated.  [facility] [phone]  Other Instructions Sinus Infection, Adult A sinus  infection, also called sinusitis, is inflammation of your sinuses. Sinuses are hollow spaces in the bones around your face. Your sinuses are located: Around your eyes. In the middle of your forehead. Behind your nose. In your cheekbones. Mucus normally drains out of your sinuses. When your nasal tissues become inflamed or swollen, mucus can become trapped or blocked. This allows bacteria, viruses, and fungi to grow, which leads to infection. Most infections of the sinuses are caused by a virus. A sinus infection can develop quickly. It can last for up to 4 weeks (acute) or for more than 12 weeks (chronic). A sinus infection often develops after a cold. What are the causes? This condition is caused by anything that creates swelling in the sinuses or stops mucus from draining. This includes: Allergies. Asthma. Infection from bacteria or viruses. Deformities or blockages in your nose or sinuses. Abnormal growths in the nose (nasal polyps). Pollutants, such as chemicals or irritants in the air. Infection from fungi. This is rare. What increases the risk? You are more likely to develop this condition if you: Have a weak body defense system (immune system). Do a lot of swimming or diving. Overuse nasal sprays. Smoke. What are the signs or symptoms? The main symptoms of this condition are pain and a feeling of pressure around the affected sinuses. Other symptoms include: Stuffy nose or congestion that makes it difficult to breathe through your nose. Thick yellow or greenish drainage from your nose. Tenderness,  swelling, and warmth over the affected sinuses. A cough that may get worse at night. Decreased sense of smell and taste. Extra mucus that collects in the throat or the back of the nose (postnasal drip) causing a sore throat or bad breath. Tiredness (fatigue). Fever. How is this diagnosed? This condition is diagnosed based on: Your symptoms. Your medical history. A physical  exam. Tests to find out if your condition is acute or chronic. This may include: Checking your nose for nasal polyps. Viewing your sinuses using a device that has a light (endoscope). Testing for allergies or bacteria. Imaging tests, such as an MRI or CT scan. In rare cases, a bone biopsy may be done to rule out more serious types of fungal sinus disease. How is this treated? Treatment for a sinus infection depends on the cause and whether your condition is chronic or acute. If caused by a virus, your symptoms should go away on their own within 10 days. You may be given medicines to relieve symptoms. They include: Medicines that shrink swollen nasal passages (decongestants). A spray that eases inflammation of the nostrils (topical intranasal corticosteroids). Rinses that help get rid of thick mucus in your nose (nasal saline washes). Medicines that treat allergies (antihistamines). Over-the-counter pain relievers. If caused by bacteria, your health care provider may recommend waiting to see if your symptoms improve. Most bacterial infections will get better without antibiotic medicine. You may be given antibiotics if you have: A severe infection. A weak immune system. If caused by narrow nasal passages or nasal polyps, surgery may be needed. Follow these instructions at home: Medicines Take, use, or apply over-the-counter and prescription medicines only as told by your health care provider. These may include nasal sprays. If you were prescribed an antibiotic medicine, take it as told by your health care provider. Do not stop taking the antibiotic even if you start to feel better. Hydrate and humidify  Drink enough fluid to keep your urine pale yellow. Staying hydrated will help to thin your mucus. Use a cool mist humidifier to keep the humidity level in your home above 50%. Inhale steam for 10-15 minutes, 3-4 times a day, or as told by your health care provider. You can do this in the  bathroom while a hot shower is running. Limit your exposure to cool or dry air. Rest Rest as much as possible. Sleep with your head raised (elevated). Make sure you get enough sleep each night. General instructions  Apply a warm, moist washcloth to your face 3-4 times a day or as told by your health care provider. This will help with discomfort. Use nasal saline washes as often as told by your health care provider. Wash your hands often with soap and water to reduce your exposure to germs. If soap and water are not available, use hand sanitizer. Do not smoke. Avoid being around people who are smoking (secondhand smoke). Keep all follow-up visits. This is important. Contact a health care provider if: You have a fever. Your symptoms get worse. Your symptoms do not improve within 10 days. Get help right away if: You have a severe headache. You have persistent vomiting. You have severe pain or swelling around your face or eyes. You have vision problems. You develop confusion. Your neck is stiff. You have trouble breathing. These symptoms may be an emergency. Get help right away. Call 911. Do not wait to see if the symptoms will go away. Do not drive yourself to the hospital. Summary A sinus  infection is soreness and inflammation of your sinuses. Sinuses are hollow spaces in the bones around your face. This condition is caused by nasal tissues that become inflamed or swollen. The swelling traps or blocks the flow of mucus. This allows bacteria, viruses, and fungi to grow, which leads to infection. If you were prescribed an antibiotic medicine, take it as told by your health care provider. Do not stop taking the antibiotic even if you start to feel better. Keep all follow-up visits. This is important. This information is not intended to replace advice given to you by your health care provider. Make sure you discuss any questions you have with your health care provider. Document Revised:  10/26/2021 Document Reviewed: 10/26/2021 Elsevier Patient Education  2024 Elsevier Inc.    If you have been instructed to have an in-person evaluation today at a local Urgent Care facility, please use the link below. It will take you to a list of all of our available [facility], including address, phone number and hours of operation. Please do not delay care.  [facility]  If you or a family member do not have a primary care provider, use the link below to schedule a visit and establish care. When you choose a [facility] primary care physician or advanced practice provider, you gain a long-term partner in health. Find a Primary Care Provider  Learn more about [facility]'s in-office and virtual care options: [facility] - Get Care Now

## 2023-10-28 NOTE — Progress Notes (Signed)
Virtual Visit Consent   Emma Gaines, you are scheduled for a virtual visit with a [facility] provider today. Just as with appointments in the office, your consent must be obtained to participate. Your consent will be active for this visit and any virtual visit you may have with one of our providers in the next 365 days. If you have a MyChart account, a copy of this consent can be sent to you electronically.  As this is a virtual visit, video technology does not allow for your provider to perform a traditional examination. This may limit your provider's ability to fully assess your condition. If your provider identifies any concerns that need to be evaluated in person or the need to arrange testing (such as labs, EKG, etc.), we will make arrangements to do so. Although advances in technology are sophisticated, we cannot ensure that it will always work on either your end or our end. If the connection with a video visit is poor, the visit may have to be switched to a telephone visit. With either a video or telephone visit, we are not always able to ensure that we have a secure connection.  By engaging in this virtual visit, you consent to the provision of healthcare and authorize for your insurance to be billed (if applicable) for the services provided during this visit. Depending on your insurance coverage, you may receive a charge related to this service.  I need to obtain your verbal consent now. Are you willing to proceed with your visit today? Emma Gaines has provided verbal consent on 10/28/2023 for a virtual visit (video or telephone). Reed Pandy, [place]  Date: 10/28/2023 3:12 PM  Virtual Visit via Video Note   I, Reed Pandy, connected with  Emma Gaines  ([id number], August 06, 2001) on 10/28/23 at  3:15 PM EST by a video-enabled telemedicine application and verified that I am speaking with the correct person using two identifiers.  Location: Patient: Virtual  Visit Location Patient: Home Provider: Virtual Visit Location Provider: Home Office   I discussed the limitations of evaluation and management by telemedicine and the availability of in person appointments. The patient expressed understanding and agreed to proceed.    History of Present Illness: Emma Gaines is a 22 y.o. who identifies as a female who was assigned female at birth, and is being seen today for c/o I'm pretty sure I have a sinus infection.  Pt states she has one last year and she had one earlier this year.  Pt states taking sudafeed, nyquil and has not helped.  Pt states symptoms on going for two weeks.  Pt states having throat pain, headache, facial pressure, congestion and feeling cold.  Mother also on call.   HPI: HPI  Problems:  Patient Active Problem List   Diagnosis Date Noted   Overweight with body mass index (BMI) of 25 to 25.9 in adult 03/14/2022   Anxiety and depression 09/21/2017   Frequent headaches 05/19/2015    Allergies:  Allergies  Allergen Reactions   Other Anaphylaxis    CORN DOG   Medications:  Current Outpatient Medications:    doxycycline (VIBRA-TABS) 100 MG tablet, Take 1 tablet (100 mg total) by mouth 2 (two) times daily for 7 days., Disp: 14 tablet, Rfl: 0   predniSONE (STERAPRED UNI-PAK 21 TAB) 10 MG (21) TBPK tablet, Take following package directions., Disp: 21 tablet, Rfl: 0   albuterol (VENTOLIN HFA) 108 (90 Base) MCG/ACT inhaler, Inhale 2 puffs into the lungs every  6 (six) hours as needed for wheezing or shortness of breath., Disp: 1 each, Rfl: 0  Observations/Objective: Patient is well-developed, well-nourished in no acute distress.  Resting comfortably at home.  Head is normocephalic, atraumatic.  No labored breathing.  Speech is clear and coherent with logical content.  Patient is alert and oriented at baseline.    Assessment and Plan: 1. Acute sinusitis, recurrence not specified, unspecified location - doxycycline  (VIBRA-TABS) 100 MG tablet; Take 1 tablet (100 mg total) by mouth 2 (two) times daily for 7 days.  Dispense: 14 tablet; Refill: 0 - predniSONE (STERAPRED UNI-PAK 21 TAB) 10 MG (21) TBPK tablet; Take following package directions.  Dispense: 21 tablet; Refill: 0  -Pt to F/U with PCP or urgent care if symptoms persist or worsen  -Pts mother states she will get the patient some Nasocort.    Follow Up Instructions: I discussed the assessment and treatment plan with the patient. The patient was provided an opportunity to ask questions and all were answered. The patient agreed with the plan and demonstrated an understanding of the instructions.  A copy of instructions were sent to the patient via MyChart unless otherwise noted below.    The patient was advised to call back or seek an in-person evaluation if the symptoms worsen or if the condition fails to improve as anticipated.    Reed Pandy, PA-C

## 2023-11-13 ENCOUNTER — Telehealth: Payer: BC Managed Care – PPO | Admitting: Nurse Practitioner

## 2023-11-13 DIAGNOSIS — J309 Allergic rhinitis, unspecified: Secondary | ICD-10-CM

## 2023-11-13 MED ORDER — FLUTICASONE PROPIONATE 50 MCG/ACT NA SUSP
2.0000 | Freq: Every day | NASAL | 6 refills | Status: AC
Start: 2023-11-13 — End: ?

## 2023-11-13 MED ORDER — PREDNISONE 20 MG PO TABS
20.0000 mg | ORAL_TABLET | Freq: Two times a day (BID) | ORAL | 0 refills | Status: AC
Start: 2023-11-13 — End: 2023-11-18

## 2023-11-13 MED ORDER — LEVOCETIRIZINE DIHYDROCHLORIDE 5 MG PO TABS
5.0000 mg | ORAL_TABLET | Freq: Every day | ORAL | 2 refills | Status: DC
Start: 2023-11-13 — End: 2024-03-07

## 2023-11-13 NOTE — Progress Notes (Signed)
Virtual Visit Consent   Emma Gaines, you are scheduled for a virtual visit with a [facility] provider today. Just as with appointments in the office, your consent must be obtained to participate. Your consent will be active for this visit and any virtual visit you may have with one of our providers in the next 365 days. If you have a MyChart account, a copy of this consent can be sent to you electronically.  As this is a virtual visit, video technology does not allow for your provider to perform a traditional examination. This may limit your provider's ability to fully assess your condition. If your provider identifies any concerns that need to be evaluated in person or the need to arrange testing (such as labs, EKG, etc.), we will make arrangements to do so. Although advances in technology are sophisticated, we cannot ensure that it will always work on either your end or our end. If the connection with a video visit is poor, the visit may have to be switched to a telephone visit. With either a video or telephone visit, we are not always able to ensure that we have a secure connection.  By engaging in this virtual visit, you consent to the provision of healthcare and authorize for your insurance to be billed (if applicable) for the services provided during this visit. Depending on your insurance coverage, you may receive a charge related to this service.  I need to obtain your verbal consent now. Are you willing to proceed with your visit today? Emma Gaines has provided verbal consent on 11/13/2023 for a virtual visit (video or telephone). Viviano Simas, FNP  Date: 11/13/2023 7:35 PM  Virtual Visit via Video Note   I, Viviano Simas, connected with  Emma Gaines  ([id number], [birth year]) on 11/13/23 at  7:45 PM EST by a video-enabled telemedicine application and verified that I am speaking with the correct person using two identifiers.  Location: Patient: Virtual Visit  Location Patient: Home Provider: Virtual Visit Location Provider: Home Office   I discussed the limitations of evaluation and management by telemedicine and the availability of in person appointments. The patient expressed understanding and agreed to proceed.    History of Present Illness: Emma Gaines is a 22 y.o. who identifies as a female who was assigned female at birth, and is being seen today for runny nose and sinus congestion with a sore throat. She has pressure in her ears and they have been "popping"  She has had sinus congestion for over a week, and now has had a low grade fever for 2 days (under 100)  She has been sneezing a lot as well   She had a sinus infection over Thanksgiving, was treated with antibiotics and prednisone at that time. Treatment started on 10/28/23, finished 11/04/23  Mother has concern that the patient may be allergic to their dog.   She tried Claritin without relief so she has been switched to [employer] daily   She has not needed her inhaler   She works as a Runner, broadcasting/film/video- seems to have worsening symptoms when she is home with their dog and improvement in symptoms when she is away   Problems:  Patient Active Problem List   Diagnosis Date Noted   Overweight with body mass index (BMI) of 25 to 25.9 in adult 03/14/2022   Anxiety and depression 09/21/2017   Frequent headaches 05/19/2015    Allergies:  Allergies  Allergen Reactions   Other Anaphylaxis  CORN DOG   Medications:   Observations/Objective: Patient is well-developed, well-nourished in no acute distress.  Resting comfortably  at home.  Head is normocephalic, atraumatic.  No labored breathing. Speech is clear and coherent with logical content.  Patient is alert and oriented at baseline.    Assessment and Plan:  1. Allergic rhinitis, unspecified seasonality, unspecified trigger  - fluticasone (FLONASE) 50 MCG/ACT nasal spray; Place 2 sprays into both nostrils daily.  Dispense:  16 g; Refill: 6 - levocetirizine (XYZAL ALLERGY 24HR) 5 MG tablet; Take 1 tablet (5 mg total) by mouth daily.  Dispense: 30 tablet; Refill: 2 - predniSONE (DELTASONE) 20 MG tablet; Take 1 tablet (20 mg total) by mouth 2 (two) times daily with a meal for 5 days.  Dispense: 10 tablet; Refill: 0    Follow up with PCP to discuss allergy workup and management  No evidence of bacterial infection- seems to be environmental irritant    Follow Up Instructions: I discussed the assessment and treatment plan with the patient. The patient was provided an opportunity to ask questions and all were answered. The patient agreed with the plan and demonstrated an understanding of the instructions.  A copy of instructions were sent to the patient via MyChart unless otherwise noted below.    The patient was advised to call back or seek an in-person evaluation if the symptoms worsen or if the condition fails to improve as anticipated.    Viviano Simas, FNP

## 2023-12-05 ENCOUNTER — Encounter: Payer: Self-pay | Admitting: Internal Medicine

## 2023-12-05 ENCOUNTER — Ambulatory Visit (INDEPENDENT_AMBULATORY_CARE_PROVIDER_SITE_OTHER): Payer: BC Managed Care – PPO | Admitting: Internal Medicine

## 2023-12-05 VITALS — BP 118/72 | Ht 64.0 in | Wt 156.2 lb

## 2023-12-05 DIAGNOSIS — J309 Allergic rhinitis, unspecified: Secondary | ICD-10-CM | POA: Diagnosis not present

## 2023-12-05 NOTE — Progress Notes (Signed)
 Subjective:    Patient ID: Emma Gaines, female    DOB: 2001/07/27, 22 y.o.   MRN: [id number]  HPI  Patient presents the clinic today requesting allergy  testing.  She has had multiple ED visits over the last 6 weeks.  She was initially seen 11/23, diagnosed with acute sinusitis and treated with doxycycline  and prednisone .  She was seen 12/9 for similar symptoms, diagnosed with allergies, prescribed xyzal  and flonase .  She reports intermittent runny nose and nasal congestion.  She is not currently having any symptoms.  She thinks she may be allergic to her dogs. She thinks she had allergy  testing as a child.  Review of Systems   Past Medical History:  Diagnosis Date   Frequent headaches     Current Outpatient Medications  Medication Sig Dispense Refill   albuterol  (VENTOLIN  HFA) 108 (90 Base) MCG/ACT inhaler Inhale 2 puffs into the lungs every 6 (six) hours as needed for wheezing or shortness of breath. 1 each 0   fluticasone  (FLONASE ) 50 MCG/ACT nasal spray Place 2 sprays into both nostrils daily. 16 g 6   levocetirizine (XYZAL  ALLERGY  24HR) 5 MG tablet Take 1 tablet (5 mg total) by mouth daily. 30 tablet 2   No current facility-administered medications for this visit.    Allergies  Allergen Reactions   Other Anaphylaxis    CORN DOG    Family History  Problem Relation Age of Onset   Healthy Mother    Healthy Father    Healthy Brother    Lung cancer Maternal Grandmother    Alcohol abuse Maternal Grandfather    Breast cancer Paternal Aunt    Colon cancer Paternal Uncle 30    Social History   Socioeconomic History   Marital status: Single    Spouse name: Not on file   Number of children: Not on file   Years of education: Not on file   Highest education level: Not on file  Occupational History   Not on file  Tobacco Use   Smoking status: Never   Smokeless tobacco: Never  Vaping Use   Vaping status: Never Used  Substance and Sexual Activity   Alcohol  use: Yes    Alcohol/week: 0.0 standard drinks of alcohol    Comment: occasional    Drug use: No   Sexual activity: Yes  Other Topics Concern   Not on file  Social History Narrative   Not on file   Social Drivers of Health   Financial Resource Strain: Not on file  Food Insecurity: Not on file  Transportation Needs: Not on file  Physical Activity: Not on file  Stress: Not on file  Social Connections: Not on file  Intimate Partner Violence: Not on file     Constitutional: Denies fever, malaise, fatigue, headache or abrupt weight changes.  HEENT: Denies eye pain, eye redness, ear pain, ringing in the ears, wax buildup, runny nose, nasal congestion, bloody nose, or sore throat. Respiratory: Denies difficulty breathing, shortness of breath, cough or sputum production.   Cardiovascular: Denies chest pain, chest tightness, palpitations or swelling in the hands or feet.  Skin: Denies redness, rashes, lesions or ulcercations.  Neurological: Denies dizziness, difficulty with memory, difficulty with speech or problems with balance and coordination.   No other specific complaints in a complete review of systems (except as listed in HPI above).      Objective:   Physical Exam  BP 118/72 (BP Location: Right Arm, Patient Position: Sitting, Cuff Size: Normal)  Ht 5' 4 (1.626 m)   Wt 156 lb 3.2 oz (70.9 kg)   LMP  (LMP Unknown)   BMI 26.81 kg/m   Wt Readings from Last 3 Encounters:  06/06/23 156 lb (70.8 kg)  04/06/23 160 lb (72.6 kg)  11/25/22 177 lb (80.3 kg)    General: Appears her stated age, overall, in NAD. Skin: Warm, dry and intact. No rashes, lesions or ulcerations noted. HEENT: Head: normal shape and size; Eyes: sclera white, no icterus, conjunctiva pink, PERRLA and EOMs intact;  Neck: No adenopathy noted. Cardiovascular: Normal rate and rhythm. S1,S2 noted.  No murmur, rubs or gallops noted.  Pulmonary/Chest: Normal effort and positive vesicular breath sounds. No  respiratory distress. No wheezes, rales or ronchi noted.  Neurological: Alert and oriented.   BMET    Component Value Date/Time   NA 141 03/14/2022 0921   K 4.3 03/14/2022 0921   CL 104 03/14/2022 0921   CO2 28 03/14/2022 0921   GLUCOSE 95 03/14/2022 0921   BUN 14 03/14/2022 0921   CREATININE 0.67 03/14/2022 0921   CALCIUM 9.7 03/14/2022 0921    Lipid Panel     Component Value Date/Time   CHOL 168 03/14/2022 0921   TRIG 63 03/14/2022 0921   HDL 64 03/14/2022 0921   CHOLHDL 2.6 03/14/2022 0921   VLDL 37.4 10/10/2018 1536   LDLCALC 89 03/14/2022 0921    CBC    Component Value Date/Time   WBC 6.2 03/14/2022 0921   RBC 4.66 03/14/2022 0921   HGB 13.9 03/14/2022 0921   HCT 42.1 03/14/2022 0921   PLT 267 03/14/2022 0921   MCV 90.3 03/14/2022 0921   MCH 29.8 03/14/2022 0921   MCHC 33.0 03/14/2022 0921   RDW 12.1 03/14/2022 0921    Hgb A1C No results found for: HGBA1C          Assessment & Plan:  Allergic rhinitis:  E-visit notes reviewed Continue Xyzal  and Flonase  daily Will check allergy  for mold, animal and grass Can do referral to allergy  for further evaluation with skin prick test  RTC in 7 months for your annual exam Angeline Laura, NP

## 2023-12-05 NOTE — Patient Instructions (Signed)
 Allergic Rhinitis, Adult  Allergic rhinitis is a reaction to allergens. Allergens are things that can cause an allergic reaction. This condition affects the lining inside the nose (mucous membrane). There are two types of allergic rhinitis: Seasonal. This type is also called hay fever. It happens only during some times of the year. Perennial. This type can happen at any time of the year. This condition cannot be spread from person to person (is not contagious). It can be mild, bad, or very bad. It can develop at any age and may be outgrown. What are the causes? Pollen from grasses, trees, and weeds. Other causes can be: Dust mites. Smoke. Mold. Car fumes. The pee (urine), spit, or dander of pets. Dander is dead skin cells from a pet. What increases the risk? You are more likely to develop this condition if: You have allergies in your family. You have problems like allergies in your family. You may have: Swelling of parts of your eyes and eyelids. Asthma. This affects how you breathe. Long-term redness and swelling on your skin. Food allergies. What are the signs or symptoms? The main symptom of this condition is a runny or stuffy nose (nasal congestion). Other symptoms may include: Sneezing or coughing. Itching and tearing of your eyes. Mucus that drips down the back of your throat (postnasal drip). This may cause a sore throat. Trouble sleeping. Feeling tired. Headache. How is this treated? There is no cure for this condition. You should avoid things that you are allergic to. Treatment can help to relieve symptoms. This may include: Medicines that block allergy symptoms, such as anti-inflammatories or antihistamines. These may be given as a shot, nasal spray, or pill. Avoiding things you are allergic to. Medicines that give you some of what you are allergic to over time. This is called immunotherapy. It is done if other treatments do not help. You may get: Shots. Medicine under  your tongue. Stronger medicines, if other treatments do not help. Follow these instructions at home: Avoiding allergens Find out what things you are allergic to and avoid them. To do this, try these things: If you get allergies any time of year: Replace carpet with wood, tile, or vinyl flooring. Carpet can trap pet dander and dust. Do not smoke. Do not allow smoking in your home. Change your heating and air conditioning filters at least once a month. If you get allergies only some times of the year: Keep windows closed when you can. Plan things to do outside when pollen counts are lowest. Check pollen counts before you plan things to do outside. When you come indoors, change your clothes and shower before you sit on furniture or bedding. If you are allergic to a pet: Keep the pet out of your bedroom. Vacuum, sweep, and dust often. General instructions Take over-the-counter and prescription medicines only as told by your doctor. Drink enough fluid to keep your pee pale yellow. Where to find more information American Academy of Allergy, Asthma & Immunology: aaaai.org Contact a doctor if: You have a fever. You get a cough that does not go away. You make high-pitched whistling sounds when you breathe, most often when you breathe out (wheeze). Your symptoms slow you down. Your symptoms stop you from doing your normal things each day. Get help right away if: You are short of breath. This symptom may be an emergency. Get help right away. Call 911. Do not wait to see if the symptom will go away. Do not drive yourself to the  hospital. This information is not intended to replace advice given to you by your health care provider. Make sure you discuss any questions you have with your health care provider. Document Revised: 08/01/2022 Document Reviewed: 08/01/2022 Elsevier Patient Education  2024 [employer].

## 2023-12-07 ENCOUNTER — Encounter: Payer: Self-pay | Admitting: Internal Medicine

## 2023-12-07 DIAGNOSIS — J309 Allergic rhinitis, unspecified: Secondary | ICD-10-CM

## 2023-12-07 LAB — ALLERGY PANEL, REGION 2, GRASSES
CLASS: 0
CLASS: 0
CLASS: 0
Class: 0
Class: 0
G005 Rye, Perennial: <0.1 kU/L
G009 Red Top: <0.1 kU/L
Johnson Grass: <0.1 kU/L
ORCHARD GRASS (COCKSFOOT) (G3) IGE: <0.1 kU/L
Timothy Grass: <0.1 kU/L

## 2023-12-07 LAB — ALLERGY PANEL, ANIMAL GROUP
Allergen, Chicken feather, e91: <0.1 kU/L
Allergen, Mouse Urine Protein, e78: <0.1 kU/L
Allergen,Goose feathers, e70: <0.1 kU/L
CLASS: 0
CLASS: 0
CLASS: 0
CLASS: 0
Class: 0
Cow Dander IgE: <0.1 kU/L
Horse dander: <0.1 kU/L

## 2023-12-07 LAB — ALLERGY PANEL 11, MOLD GROUP
Allergen, A. alternata, m6: <0.1 kU/L
Allergen, Mucor Racemosus, M4: <0.1 kU/L
Aspergillus fumigatus, m3: <0.1 kU/L
CLADOSPORIUM HERBARUM (M2) IGE: <0.1 kU/L
CLASS: 0
CLASS: 0
Candida Albicans: <0.1 kU/L
Class: 0
Class: 0
Class: 0

## 2023-12-07 LAB — INTERPRETATION:

## 2023-12-11 ENCOUNTER — Encounter: Payer: Self-pay | Admitting: Internal Medicine

## 2023-12-11 DIAGNOSIS — N920 Excessive and frequent menstruation with regular cycle: Secondary | ICD-10-CM

## 2024-03-07 ENCOUNTER — Telehealth: Admitting: Physician Assistant

## 2024-03-07 DIAGNOSIS — J309 Allergic rhinitis, unspecified: Secondary | ICD-10-CM

## 2024-03-07 DIAGNOSIS — J0140 Acute pansinusitis, unspecified: Secondary | ICD-10-CM

## 2024-03-07 MED ORDER — DOXYCYCLINE HYCLATE 100 MG PO TABS
100.0000 mg | ORAL_TABLET | Freq: Two times a day (BID) | ORAL | 0 refills | Status: DC
Start: 2024-03-07 — End: 2024-05-24

## 2024-03-07 MED ORDER — LEVOCETIRIZINE DIHYDROCHLORIDE 5 MG PO TABS: 5.0000 mg | ORAL_TABLET | Freq: Every day | ORAL | 0 refills | Status: AC

## 2024-03-07 MED ORDER — METHYLPREDNISOLONE 4 MG PO TBPK
ORAL_TABLET | ORAL | 0 refills | Status: DC
Start: 2024-03-07 — End: 2024-05-24

## 2024-03-07 NOTE — Progress Notes (Signed)
 Virtual Visit Consent   Emma Gaines, you are scheduled for a virtual visit with a [facility] provider today. Just as with appointments in the office, your consent must be obtained to participate. Your consent will be active for this visit and any virtual visit you may have with one of our providers in the next 365 days. If you have a MyChart account, a copy of this consent can be sent to you electronically.  As this is a virtual visit, video technology does not allow for your provider to perform a traditional examination. This may limit your provider's ability to fully assess your condition. If your provider identifies any concerns that need to be evaluated in person or the need to arrange testing (such as labs, EKG, etc.), we will make arrangements to do so. Although advances in technology are sophisticated, we cannot ensure that it will always work on either your end or our end. If the connection with a video visit is poor, the visit may have to be switched to a telephone visit. With either a video or telephone visit, we are not always able to ensure that we have a secure connection.  By engaging in this virtual visit, you consent to the provision of healthcare and authorize for your insurance to be billed (if applicable) for the services provided during this visit. Depending on your insurance coverage, you may receive a charge related to this service.  I need to obtain your verbal consent now. Are you willing to proceed with your visit today? Emma Gaines has provided verbal consent on 03/07/2024 for a virtual visit (video or telephone). Emma Gaines, [place]  Date: 03/07/2024 7:42 PM   Virtual Visit via Video Note   I, Trajan [place], connected with  Emma Gaines  ([id number], 2001/03/26) on 03/07/24 at  7:30 PM EDT by a video-enabled telemedicine application and verified that I am speaking with the correct person using two identifiers.  Location: Patient: Virtual Visit  Location Patient: Home Provider: Virtual Visit Location Provider: Home Office   I discussed the limitations of evaluation and management by telemedicine and the availability of in person appointments. The patient expressed understanding and agreed to proceed.    History of Present Illness: Emma Gaines is a 23 y.o. who identifies as a female who was assigned female at birth, and is being seen today for sinusitis.  HPI: 22y/o F presents to video telehealth visit for c/o sinus pressure, pain, post nasal congestion and drainage, itchy throat, R ear dicomfort x 2 weeks without improvement with oral anti-histamine, oral decongestant, and nasal steroid spray together. Denies fever. Pt is traveling soon and requesting an antibiotic in case she feels worse.     Problems:  Patient Active Problem List   Diagnosis Date Noted   Overweight with body mass index (BMI) of 25 to 25.9 in adult 03/14/2022   Anxiety and depression 09/21/2017   Frequent headaches 05/19/2015    Allergies:  Allergies  Allergen Reactions   Other Anaphylaxis    CORN DOG   Medications:  Current Outpatient Medications:    doxycycline (VIBRA-TABS) 100 MG tablet, Take 1 tablet (100 mg total) by mouth 2 (two) times daily., Disp: 14 tablet, Rfl: 0   methylPREDNISolone (MEDROL DOSEPAK) 4 MG TBPK tablet, Take as directed, Disp: 1 each, Rfl: 0   albuterol (VENTOLIN HFA) 108 (90 Base) MCG/ACT inhaler, Inhale 2 puffs into the lungs every 6 (six) hours as needed for wheezing or shortness of breath., Disp: 1  each, Rfl: 0   fluticasone (FLONASE) 50 MCG/ACT nasal spray, Place 2 sprays into both nostrils daily., Disp: 16 g, Rfl: 6   levocetirizine (XYZAL ALLERGY 24HR) 5 MG tablet, Take 1 tablet (5 mg total) by mouth daily., Disp: 30 tablet, Rfl: 0  Observations/Objective: Patient is well-developed, well-nourished in no acute distress.  Resting comfortably  at home.  Head is normocephalic, atraumatic.  No labored breathing.   Speech is clear and coherent with logical content.  Patient is alert and oriented at baseline.    Assessment and Plan: 1. Acute non-recurrent pansinusitis (Primary) - doxycycline (VIBRA-TABS) 100 MG tablet; Take 1 tablet (100 mg total) by mouth 2 (two) times daily.  Dispense: 14 tablet; Refill: 0 - methylPREDNISolone (MEDROL DOSEPAK) 4 MG TBPK tablet; Take as directed  Dispense: 1 each; Refill: 0  2. Allergic rhinitis, unspecified seasonality, unspecified trigger - levocetirizine (XYZAL ALLERGY 24HR) 5 MG tablet; Take 1 tablet (5 mg total) by mouth daily.  Dispense: 30 tablet; Refill: 0  Stay well hydrated. Start oral steroids as prescribed. If symptoms don't improve in 3-4 days then may consider starting oral antibiotic. Continue with Flonase nasal spray. Schedule a virtual appointment if symptoms don't improve.  Pt verbalized understanding and in agreement.     Follow Up Instructions: I discussed the assessment and treatment plan with the patient. The patient was provided an opportunity to ask questions and all were answered. The patient agreed with the plan and demonstrated an understanding of the instructions.  A copy of instructions were sent to the patient via MyChart unless otherwise noted below.   Patient has requested to receive PHI (AVS, Work Notes, etc) pertaining to this video visit through e-mail as they are currently without active MyChart. They have voiced understand that email is not considered secure and their health information could be viewed by someone other than the patient.   The patient was advised to call back or seek an in-person evaluation if the symptoms worsen or if the condition fails to improve as anticipated.    Emma Better, PA-C

## 2024-03-07 NOTE — Patient Instructions (Signed)
  Emma Gaines, thank you for joining Gilberto Better, PA-C for today's virtual visit.  While this provider is not your primary care provider (PCP), if your PCP is located in our provider database this encounter information will be shared with them immediately following your visit.   A [facility] MyChart account gives you access to today's visit and all your visits, tests, and labs performed at [facility] " click here if you don't have a [facility] MyChart account or go to [email or web address]  Consent: (Patient) Emma Gaines provided verbal consent for this virtual visit at the beginning of the encounter.  Current Medications:  Current Outpatient Medications:    doxycycline (VIBRA-TABS) 100 MG tablet, Take 1 tablet (100 mg total) by mouth 2 (two) times daily., Disp: 14 tablet, Rfl: 0   methylPREDNISolone (MEDROL DOSEPAK) 4 MG TBPK tablet, Take as directed, Disp: 1 each, Rfl: 0   albuterol (VENTOLIN HFA) 108 (90 Base) MCG/ACT inhaler, Inhale 2 puffs into the lungs every 6 (six) hours as needed for wheezing or shortness of breath., Disp: 1 each, Rfl: 0   fluticasone (FLONASE) 50 MCG/ACT nasal spray, Place 2 sprays into both nostrils daily., Disp: 16 g, Rfl: 6   levocetirizine (XYZAL ALLERGY 24HR) 5 MG tablet, Take 1 tablet (5 mg total) by mouth daily., Disp: 30 tablet, Rfl: 0   Medications ordered in this encounter:  Meds ordered this encounter  Medications   doxycycline (VIBRA-TABS) 100 MG tablet    Sig: Take 1 tablet (100 mg total) by mouth 2 (two) times daily.    Dispense:  14 tablet    Refill:  0    Supervising Provider:   Merrilee Jansky [[id number]]   methylPREDNISolone (MEDROL DOSEPAK) 4 MG TBPK tablet    Sig: Take as directed    Dispense:  1 each    Refill:  0    Supervising Provider:   Merrilee Jansky [[id number]]   levocetirizine (XYZAL ALLERGY 24HR) 5 MG tablet    Sig: Take 1 tablet (5 mg total) by mouth daily.    Dispense:  30 tablet     Refill:  0    Supervising Provider:   Merrilee Jansky [[id number]]     *If you need refills on other medications prior to your next appointment, please contact your pharmacy*  Follow-Up: Call back or seek an in-person evaluation if the symptoms worsen or if the condition fails to improve as anticipated.  [facility] [phone]  Other Instructions Stay well hydrated. Start oral steroids as prescribed. If symptoms don't improve in 3-4 days then may consider starting oral antibiotic. Continue with Flonase nasal spray. Schedule a virtual appointment if symptoms don't improve.    If you have been instructed to have an in-person evaluation today at a local Urgent Care facility, please use the link below. It will take you to a list of all of our available [facility], including address, phone number and hours of operation. Please do not delay care.  [facility]  If you or a family member do not have a primary care provider, use the link below to schedule a visit and establish care. When you choose a [facility] primary care physician or advanced practice provider, you gain a long-term partner in health. Find a Primary Care Provider  Learn more about [facility]'s in-office and virtual care options: [facility] - Get Care Now

## 2024-04-19 ENCOUNTER — Encounter: Payer: Self-pay | Admitting: Internal Medicine

## 2024-05-19 ENCOUNTER — Encounter: Payer: Self-pay | Admitting: Internal Medicine

## 2024-05-21 ENCOUNTER — Encounter: Payer: Self-pay | Admitting: Internal Medicine

## 2024-05-21 ENCOUNTER — Ambulatory Visit: Admitting: Internal Medicine

## 2024-05-21 VITALS — BP 118/72 | Ht 64.0 in | Wt 154.2 lb

## 2024-05-21 DIAGNOSIS — N6312 Unspecified lump in the right breast, upper inner quadrant: Secondary | ICD-10-CM | POA: Diagnosis not present

## 2024-05-21 NOTE — Progress Notes (Signed)
 Subjective:    Patient ID: Emma Gaines, female    DOB: 2001-07-16, 23 y.o.   MRN: [id number]  HPI  Discussed the use of AI scribe software for clinical note transcription with the patient, who gave verbal consent to proceed.  Emma Gaines is a 23 year old female who presents with a lump in her right breast.  She noticed a lump in her right breast three days ago. She is uncertain if the lump has increased in size but reports increased tenderness, which she attributes to frequent palpation. No skin changes or nipple discharge are present. She is due to start her menstrual period today, which may be relevant to the timing of the lump's appearance.  Her family history is significant for breast cancer in her paternal aunt.  No changes in the skin or nipple discharge.       Review of Systems   Past Medical History:  Diagnosis Date   Frequent headaches     Current Outpatient Medications  Medication Sig Dispense Refill   albuterol  (VENTOLIN  HFA) 108 (90 Base) MCG/ACT inhaler Inhale 2 puffs into the lungs every 6 (six) hours as needed for wheezing or shortness of breath. 1 each 0   doxycycline  (VIBRA -TABS) 100 MG tablet Take 1 tablet (100 mg total) by mouth 2 (two) times daily. 14 tablet 0   fluticasone  (FLONASE ) 50 MCG/ACT nasal spray Place 2 sprays into both nostrils daily. 16 g 6   levocetirizine (XYZAL  ALLERGY  24HR) 5 MG tablet Take 1 tablet (5 mg total) by mouth daily. 30 tablet 0   methylPREDNISolone  (MEDROL  DOSEPAK) 4 MG TBPK tablet Take as directed 1 each 0   No current facility-administered medications for this visit.    Allergies  Allergen Reactions   Other Anaphylaxis    CORN DOG    Family History  Problem Relation Age of Onset   Healthy Mother    Healthy Father    Healthy Brother    Lung cancer Maternal Grandmother    Alcohol abuse Maternal Grandfather    Breast cancer Paternal Aunt    Colon cancer Paternal Uncle 29    Social History    Socioeconomic History   Marital status: Single    Spouse name: Not on file   Number of children: Not on file   Years of education: Not on file   Highest education level: Not on file  Occupational History   Not on file  Tobacco Use   Smoking status: Never   Smokeless tobacco: Never  Vaping Use   Vaping status: Never Used  Substance and Sexual Activity   Alcohol use: Yes    Alcohol/week: 0.0 standard drinks of alcohol    Comment: occasional    Drug use: No   Sexual activity: Yes  Other Topics Concern   Not on file  Social History Narrative   Not on file   Social Drivers of Health   Financial Resource Strain: Not on file  Food Insecurity: Not on file  Transportation Needs: Not on file  Physical Activity: Not on file  Stress: Not on file  Social Connections: Not on file  Intimate Partner Violence: Not on file     Constitutional: Denies fever, malaise, fatigue, headache or abrupt weight changes.  HEENT: Denies eye pain, eye redness, ear pain, ringing in the ears, wax buildup, runny nose, nasal congestion, bloody nose, or sore throat. Respiratory: Denies difficulty breathing, shortness of breath, cough or sputum production.   Cardiovascular: Denies chest pain,  chest tightness, palpitations or swelling in the hands or feet.  Skin: Patient reports lump in breast.  Denies redness, rashes, lesions or ulcercations.  Neurological: Denies dizziness, difficulty with memory, difficulty with speech or problems with balance and coordination.   No other specific complaints in a complete review of systems (except as listed in HPI above).      Objective:   Physical Exam   Wt Readings from Last 3 Encounters:  12/05/23 156 lb 3.2 oz (70.9 kg)  06/06/23 156 lb (70.8 kg)  04/06/23 160 lb (72.6 kg)    General: Appears her stated age, overall, in NAD. Breasts: Breast symmetrical.  2 cm cluster of nodules noted at 1:00 in the right breast.  No changes in the skin.  No discharge  noted from the nipple.  No axillary adenopathy noted. Cardiovascular: Normal rate and rhythm.  Pulmonary/Chest: Normal effort and positive vesicular breath sounds. No respiratory distress. No wheezes, rales or ronchi noted.  Neurological: Alert and oriented.   BMET    Component Value Date/Time   NA 141 03/14/2022 0921   K 4.3 03/14/2022 0921   CL 104 03/14/2022 0921   CO2 28 03/14/2022 0921   GLUCOSE 95 03/14/2022 0921   BUN 14 03/14/2022 0921   CREATININE 0.67 03/14/2022 0921   CALCIUM 9.7 03/14/2022 0921    Lipid Panel     Component Value Date/Time   CHOL 168 03/14/2022 0921   TRIG 63 03/14/2022 0921   HDL 64 03/14/2022 0921   CHOLHDL 2.6 03/14/2022 0921   VLDL 37.4 10/10/2018 1536   LDLCALC 89 03/14/2022 0921    CBC    Component Value Date/Time   WBC 6.2 03/14/2022 0921   RBC 4.66 03/14/2022 0921   HGB 13.9 03/14/2022 0921   HCT 42.1 03/14/2022 0921   PLT 267 03/14/2022 0921   MCV 90.3 03/14/2022 0921   MCH 29.8 03/14/2022 0921   MCHC 33.0 03/14/2022 0921   RDW 12.1 03/14/2022 0921    Hgb A1C No results found for: HGBA1C          Assessment & Plan:  Assessment and Plan    Breast lump Palpable right breast lump, likely cystic and related to menstrual cycle. Family history of breast cancer necessitates ultrasound to exclude malignancy. Benign indicators include absence of skin dimpling, discoloration, and nipple discharge. - Order breast ultrasound. - Advise to avoid palpating the lump. - Inform that results will be provided on the day of the ultrasound.    RTC in 1 months for your annual exam Helayne Lo, NP

## 2024-05-21 NOTE — Patient Instructions (Signed)
 Breast Self-Awareness Breast self-awareness is knowing how your breasts look and feel. You need to: Check your breasts on a regular basis. Tell your doctor about any changes. Become familiar with the look and feel of your breasts. This can help you catch a breast problem while it is still small and can be treated. You should do breast self-exams even if you have breast implants. What you need: A mirror. A well-lit room. A pillow or other soft object. How to do a breast self-exam Follow these steps to do a breast self-exam: Look for changes  Take off all the clothes above your waist. Stand in front of a mirror in a room with good lighting. Put your hands down at your sides. Compare your breasts in the mirror. Look for any difference between them, such as: A difference in shape. A difference in size. Wrinkles, dips, and bumps in one breast and not the other. Look at each breast for changes in the skin, such as: Redness. Scaly areas. Skin that has gotten thicker. Dimpling. Open sores (ulcers). Look for changes in your nipples, such as: Fluid coming out of a nipple. Fluid around a nipple. Bleeding. Dimpling. Redness. A nipple that looks pushed in (retracted), or that has changed position. Feel for changes Lie on your back. Feel each breast. To do this: Pick a breast to feel. Place a pillow under the shoulder closest to that breast. Put the arm closest to that breast behind your head. Feel the nipple area of that breast using the hand of your other arm. Feel the area with the pads of your three middle fingers by making small circles with your fingers. Use light, medium, and firm pressure. Continue the overlapping circles, moving downward over the breast. Keep making circles with your fingers. Stop when you feel your ribs. Start making circles with your fingers again, this time going upward until you reach your collarbone. Then, make circles outward across your breast and into your  armpit area. Squeeze your nipple. Check for discharge and lumps. Repeat these steps to check your other breast. Sit or stand in the tub or shower. With soapy water on your skin, feel each breast the same way you did when you were lying down. Write down what you find Writing down what you find can help you remember what to tell your doctor. Write down: What is normal for each breast. Any changes you find in each breast. These include: The kind of changes you find. A tender or painful breast. Any lump you find. Write down its size and where it is. When you last had your monthly period (menstrual cycle). General tips If you are breastfeeding, the best time to check your breasts is after you feed your baby or after you use a breast pump. If you get monthly bleeding, the best time to check your breasts is 5-7 days after your monthly cycle ends. With time, you will become comfortable with the self-exam. You will also start to know if there are changes in your breasts. Contact a doctor if: You see a change in the shape or size of your breasts or nipples. You see a change in the skin of your breast or nipples, such as red or scaly skin. You have fluid coming from your nipples that is not normal. You find a new lump or thick area. You have breast pain. You have any concerns about your breast health. Summary Breast self-awareness includes looking for changes in your breasts and feeling for changes  within your breasts. You should do breast self-awareness in front of a mirror in a well-lit room. If you get monthly periods (menstrual cycles), the best time to check your breasts is 5-7 days after your period ends. Tell your doctor about any changes you see in your breasts. Changes include changes in size, changes on the skin, painful or tender breasts, or fluid from your nipples that is not normal. This information is not intended to replace advice given to you by your health care provider. Make sure  you discuss any questions you have with your health care provider. Document Revised: 04/28/2022 Document Reviewed: 09/23/2021 Elsevier Patient Education  2024 [employer].

## 2024-05-24 ENCOUNTER — Ambulatory Visit (INDEPENDENT_AMBULATORY_CARE_PROVIDER_SITE_OTHER): Admitting: Internal Medicine

## 2024-05-24 ENCOUNTER — Encounter: Payer: Self-pay | Admitting: Internal Medicine

## 2024-05-24 VITALS — BP 110/64 | Ht 64.0 in | Wt 157.0 lb

## 2024-05-24 DIAGNOSIS — E663 Overweight: Secondary | ICD-10-CM

## 2024-05-24 DIAGNOSIS — Z0001 Encounter for general adult medical examination with abnormal findings: Secondary | ICD-10-CM | POA: Diagnosis not present

## 2024-05-24 DIAGNOSIS — Z6826 Body mass index (BMI) 26.0-26.9, adult: Secondary | ICD-10-CM | POA: Diagnosis not present

## 2024-05-24 NOTE — Assessment & Plan Note (Signed)
 Encouraged diet and exercise for weight loss ?

## 2024-05-24 NOTE — Patient Instructions (Signed)

## 2024-05-24 NOTE — Progress Notes (Signed)
 Subjective:    Patient ID: Emma Gaines, female    DOB: August 22, 2001, 23 y.o.   MRN: [id number]  HPI  Patient presents to the clinic today for her annual exam.    Flu: 10/2014 Tetanus: 05/2021 COVID: Never Pap smear: 05/2021 Dentist: biannually  Diet: She does eat meat. She consumes fruits and veggies. She does eat some fried foods. She drinks mostly water, hot tea. Exercise: Lifting weights   Review of Systems     Past Medical History:  Diagnosis Date   Frequent headaches     Current Outpatient Medications  Medication Sig Dispense Refill   albuterol  (VENTOLIN  HFA) 108 (90 Base) MCG/ACT inhaler Inhale 2 puffs into the lungs every 6 (six) hours as needed for wheezing or shortness of breath. 1 each 0   doxycycline  (VIBRA -TABS) 100 MG tablet Take 1 tablet (100 mg total) by mouth 2 (two) times daily. 14 tablet 0   EPINEPHrine  0.3 mg/0.3 mL IJ SOAJ injection Inject 0.3 mg into the muscle as needed for anaphylaxis.     fluticasone  (FLONASE ) 50 MCG/ACT nasal spray Place 2 sprays into both nostrils daily. 16 g 6   levocetirizine (XYZAL  ALLERGY  24HR) 5 MG tablet Take 1 tablet (5 mg total) by mouth daily. 30 tablet 0   methylPREDNISolone  (MEDROL  DOSEPAK) 4 MG TBPK tablet Take as directed 1 each 0   No current facility-administered medications for this visit.    Allergies  Allergen Reactions   Other Anaphylaxis    CORN DOG    Family History  Problem Relation Age of Onset   Healthy Mother    Healthy Father    Healthy Brother    Lung cancer Maternal Grandmother    Alcohol abuse Maternal Grandfather    Breast cancer Paternal Aunt    Colon cancer Paternal Uncle 23    Social History   Socioeconomic History   Marital status: Single    Spouse name: Not on file   Number of children: Not on file   Years of education: Not on file   Highest education level: Not on file  Occupational History   Not on file  Tobacco Use   Smoking status: Never   Smokeless tobacco:  Never  Vaping Use   Vaping status: Never Used  Substance and Sexual Activity   Alcohol use: Yes    Alcohol/week: 0.0 standard drinks of alcohol    Comment: occasional    Drug use: No   Sexual activity: Yes  Other Topics Concern   Not on file  Social History Narrative   Not on file   Social Drivers of Health   Financial Resource Strain: Not on file  Food Insecurity: Not on file  Transportation Needs: Not on file  Physical Activity: Not on file  Stress: Not on file  Social Connections: Not on file  Intimate Partner Violence: Not on file     Constitutional: Patient reports intermittent headaches.  Denies fever, malaise, fatigue, or abrupt weight changes.  HEENT: Denies eye pain, eye redness, ear pain, ringing in the ears, wax buildup, runny nose, nasal congestion, bloody nose, or sore throat. Respiratory: Denies difficulty breathing, shortness of breath, cough or sputum production.   Cardiovascular: Denies chest pain, chest tightness, palpitations or swelling in the hands or feet.  Gastrointestinal: Denies abdominal pain, bloating, constipation, diarrhea or blood in the stool.  GU: Denies urgency, frequency, pain with urination, burning sensation, blood in urine, odor or discharge. Musculoskeletal: Denies decrease in range of motion, difficulty with  gait, muscle pain or joint pain and swelling.  Skin: Patient reports mass of right breast.  Denies redness, rashes, or ulcercations.  Neurological: Denies dizziness, difficulty with memory, difficulty with speech or problems with balance and coordination.  Psych: Patient has a history of anxiety and depression.  Denies SI/HI.  No other specific complaints in a complete review of systems (except as listed in HPI above).  Objective:   Physical Exam   BP 110/64 (BP Location: Left Arm, Patient Position: Sitting, Cuff Size: Normal)   Ht 5' 4 (1.626 m)   Wt 157 lb (71.2 kg)   LMP 05/23/2024 (Exact Date)   BMI 26.95 kg/m    Wt  Readings from Last 3 Encounters:  05/21/24 154 lb 3.2 oz (69.9 kg)  12/05/23 156 lb 3.2 oz (70.9 kg)  06/06/23 156 lb (70.8 kg)    General: Appears her stated age, overweight in NAD. Skin: Warm, dry and intact.  2 cm cluster of nodules noted at 1:00 in the right breast. HEENT: Head: normal shape and size; Eyes: sclera white, no icterus, conjunctiva pink, PERRLA and EOMs intact;  Neck:  Neck supple, trachea midline. No masses, lumps or thyromegaly present.  Cardiovascular: Normal rate and rhythm. S1,S2 noted.  No murmur, rubs or gallops noted. No JVD or BLE edema.  Pulmonary/Chest: Normal effort and positive vesicular breath sounds. No respiratory distress. No wheezes, rales or ronchi noted.  Abdomen: Normal bowel sounds.  Musculoskeletal: Strength 5/5 BUE/BLE. No difficulty with gait.  Neurological: Alert and oriented. Cranial nerves II-XII grossly intact. Coordination normal.  Psychiatric: Mood and affect normal. Behavior is normal. Judgment and thought content normal.    BMET    Component Value Date/Time   NA 141 03/14/2022 0921   K 4.3 03/14/2022 0921   CL 104 03/14/2022 0921   CO2 28 03/14/2022 0921   GLUCOSE 95 03/14/2022 0921   BUN 14 03/14/2022 0921   CREATININE 0.67 03/14/2022 0921   CALCIUM 9.7 03/14/2022 0921    Lipid Panel     Component Value Date/Time   CHOL 168 03/14/2022 0921   TRIG 63 03/14/2022 0921   HDL 64 03/14/2022 0921   CHOLHDL 2.6 03/14/2022 0921   VLDL 37.4 10/10/2018 1536   LDLCALC 89 03/14/2022 0921    CBC    Component Value Date/Time   WBC 6.2 03/14/2022 0921   RBC 4.66 03/14/2022 0921   HGB 13.9 03/14/2022 0921   HCT 42.1 03/14/2022 0921   PLT 267 03/14/2022 0921   MCV 90.3 03/14/2022 0921   MCH 29.8 03/14/2022 0921   MCHC 33.0 03/14/2022 0921   RDW 12.1 03/14/2022 0921    Hgb A1C No results found for: HGBA1C         Assessment & Plan:   Preventative health maintenance:  Encouraged her to get a flu shot in the  fall Tetanus UTD Encouraged her to get her COVID-vaccine Pap smear UTD Encouraged her to consume a balanced diet and exercise regimen Advised her to see a dentist annually She declines lab work today  RTC in 1 year, sooner if needed Helayne Lo, NP

## 2024-05-30 ENCOUNTER — Ambulatory Visit
Admission: RE | Admit: 2024-05-30 | Discharge: 2024-05-30 | Disposition: A | Discharge status: Home / Self Care | Source: Ambulatory Visit | Attending: Internal Medicine | Admitting: Internal Medicine

## 2024-05-30 DIAGNOSIS — N6312 Unspecified lump in the right breast, upper inner quadrant: Secondary | ICD-10-CM | POA: Insufficient documentation

## 2024-09-09 ENCOUNTER — Encounter: Payer: Self-pay | Admitting: Internal Medicine

## 2024-09-09 ENCOUNTER — Telehealth (INDEPENDENT_AMBULATORY_CARE_PROVIDER_SITE_OTHER): Admitting: Internal Medicine

## 2024-09-09 DIAGNOSIS — L700 Acne vulgaris: Secondary | ICD-10-CM | POA: Diagnosis not present

## 2024-09-09 MED ORDER — SPIRONOLACTONE 25 MG PO TABS: 25.0000 mg | ORAL_TABLET | Freq: Every day | ORAL | 1 refills | Status: AC

## 2024-09-09 NOTE — Patient Instructions (Signed)
 Acne  Acne is a skin problem that causes small, red bumps (pimples or papules) and other skin changes. Your skin has tiny holes called pores. Each pore has an oil gland. Acne happens when pores get blocked. Your pores may get red, sore, and swollen. They may also get infected. Acne is common among teens. Acne usually goes away with time. What are the causes? Acne is caused when: Oil glands get blocked by oil, dead skin cells, and dirt. Germs (bacteria) that live in your oil glands grow in number and cause infection. Acne can start with changes in hormones. These changes can make acne worse. They occur: During your teen years (adolescence). During your monthly period (menstrual cycle). If you get pregnant. Other things that can make acne worse include: Makeup, creams, and hair products that have oil in them. Stress. Diseases that cause changes in hormones. Some medicines. Tight headbands, backpacks, or shoulder pads. Being near certain oils and chemicals. Foods that are high in sugars. These include dairy products, sweets, and chocolates. What increases the risk? Being a teen. Having people in your family who have had acne. What are the signs or symptoms? Symptoms of this condition include: Small, red bumps. Whiteheads. Blackheads. Small, pus-filled bumps (pustules). Big, red bumps that feel tender. Acne that is very bad can cause: Abscesses. These are areas on your body that have pus. Cysts. These are hard, painful sacs that have fluid. Scars. These can form after large pimples heal. How is this treated? Treatment for acne depends on how bad your acne is. It may include: Creams and lotions. These can: Keep the pores of your skin open. Treat or prevent infections and swelling. Medicines that treat infections (antibiotics). These can be put on your skin or taken as pills. Pills that lower the amount of oil in your skin. Birth control pills. Treatments using lights or  lasers. Shots of medicine into the areas with acne. Chemicals that make the skin peel. Surgery. Your doctor will also tell you the best way to take care of your skin. Follow these instructions at home: Skin care Good skin care is the best thing you can do to treat your acne. Take care of your skin as told by your doctor. You may be told to do these things: Wash your skin gently. Wash at least two times each day. Also, wash: After you exercise. Before you go to bed. Use mild soap. After you wash your skin, put a water-based lotion on it for moisture. Use a sunscreen or sunblock with SPF 30 or greater. Put this on often. Acne medicines may make it easier for your skin to burn in the sun. Choose makeup and creams that will not block your oil glands (are noncomedogenic). Medicines Take over-the-counter and prescription medicines only as told by your doctor. If you were prescribed antibiotics, use them as told by your doctor. Do not stop using them even if your acne gets better. General instructions Keep your hair clean and off your face. If you have oily hair, shampoo it often or daily. Avoid wearing tight headbands or hats. Avoid picking or squeezing your pimples. Picking or squeezing can make acne worse and make scars form. Shave gently. Shave only when you have to. Keep a food journal. This can help you see if any foods are linked to your acne. Try to deal with and lower your stress. Keep all follow-up visits. Your doctor needs to watch for changes in your acne and may need to change  your treatments. Contact a doctor if: Your acne is not better after 8 weeks. Your acne gets worse. A large area of your skin gets red or tender. You think that you are having side effects from any acne medicine. This information is not intended to replace advice given to you by your health care provider. Make sure you discuss any questions you have with your health care provider. Document Revised:  04/28/2022 Document Reviewed: 04/28/2022 Elsevier Patient Education  2024 [employer].

## 2024-09-09 NOTE — Progress Notes (Signed)
 Virtual Visit via Video Note  I connected with Emma Gaines on 09/09/24 at  1:40 PM EDT by a video enabled telemedicine application and verified that I am speaking with the correct person using two identifiers.  Location: Patient: At work Provider: Herbalist participating in this video call: Angeline Laura, NP-C and Tangi Shroff   I discussed the limitations of evaluation and management by telemedicine and the availability of in person appointments. The patient expressed understanding and agreed to proceed.  History of Present Illness:   Discussed the use of AI scribe software for clinical note transcription with the patient, who gave verbal consent to proceed.  Emma Gaines is a 23 year old female who presents with persistent under the skin acne around the jaw and chin.  She experiences persistent acne primarily around her jaw and chin, which she describes as similar to cystic acne. The lesions do not come to a head and have been resistant to various skincare routines and teas she has tried.      Past Medical History:  Diagnosis Date   Frequent headaches     Current Outpatient Medications  Medication Sig Dispense Refill   albuterol  (VENTOLIN  HFA) 108 (90 Base) MCG/ACT inhaler Inhale 2 puffs into the lungs every 6 (six) hours as needed for wheezing or shortness of breath. 1 each 0   EPINEPHrine  0.3 mg/0.3 mL IJ SOAJ injection Inject 0.3 mg into the muscle as needed for anaphylaxis.     fluticasone  (FLONASE ) 50 MCG/ACT nasal spray Place 2 sprays into both nostrils daily. 16 g 6   levocetirizine (XYZAL  ALLERGY  24HR) 5 MG tablet Take 1 tablet (5 mg total) by mouth daily. 30 tablet 0   No current facility-administered medications for this visit.    Allergies  Allergen Reactions   Other Anaphylaxis    CORN DOG    Family History  Problem Relation Age of Onset   Healthy Mother    Healthy Father    Healthy Brother    Lung cancer Maternal Grandmother     Alcohol abuse Maternal Grandfather    Breast cancer Paternal Aunt    Colon cancer Paternal Uncle 65    Social History   Socioeconomic History   Marital status: Single    Spouse name: Not on file   Number of children: Not on file   Years of education: Not on file   Highest education level: Not on file  Occupational History   Not on file  Tobacco Use   Smoking status: Never   Smokeless tobacco: Never  Vaping Use   Vaping status: Never Used  Substance and Sexual Activity   Alcohol use: Yes    Alcohol/week: 0.0 standard drinks of alcohol    Comment: occasional    Drug use: No   Sexual activity: Yes  Other Topics Concern   Not on file  Social History Narrative   Not on file   Social Drivers of Health   Financial Resource Strain: Not on file  Food Insecurity: Not on file  Transportation Needs: Not on file  Physical Activity: Not on file  Stress: Not on file  Social Connections: Not on file  Intimate Partner Violence: Not on file     Constitutional: Denies fever, malaise, fatigue, headache or abrupt weight changes.  Respiratory: Denies difficulty breathing, shortness of breath, cough or sputum production.   Cardiovascular: Denies chest pain, chest tightness, palpitations or swelling in the hands or feet.  Skin: Pt reports acne. Denies  redness, rashes, lesions or ulcercations.  Neurological: Denies dizziness, difficulty with memory, difficulty with speech or problems with balance and coordination.    No other specific complaints in a complete review of systems (except as listed in HPI above).  Observations/Objective:   Wt Readings from Last 3 Encounters:  05/24/24 157 lb (71.2 kg)  05/21/24 154 lb 3.2 oz (69.9 kg)  12/05/23 156 lb 3.2 oz (70.9 kg)    General: Appears her stated age, well developed, well nourished in NAD. Skin: Warm, dry and intact.  Pulmonary/Chest: Normal effort. No respiratory distress. Neurological: Alert and oriented.  BMET     Component Value Date/Time   NA 141 03/14/2022 0921   K 4.3 03/14/2022 0921   CL 104 03/14/2022 0921   CO2 28 03/14/2022 0921   GLUCOSE 95 03/14/2022 0921   BUN 14 03/14/2022 0921   CREATININE 0.67 03/14/2022 0921   CALCIUM 9.7 03/14/2022 0921    Lipid Panel     Component Value Date/Time   CHOL 168 03/14/2022 0921   TRIG 63 03/14/2022 0921   HDL 64 03/14/2022 0921   CHOLHDL 2.6 03/14/2022 0921   VLDL 37.4 10/10/2018 1536   LDLCALC 89 03/14/2022 0921    CBC    Component Value Date/Time   WBC 6.2 03/14/2022 0921   RBC 4.66 03/14/2022 0921   HGB 13.9 03/14/2022 0921   HCT 42.1 03/14/2022 0921   PLT 267 03/14/2022 0921   MCV 90.3 03/14/2022 0921   MCH 29.8 03/14/2022 0921   MCHC 33.0 03/14/2022 0921   RDW 12.1 03/14/2022 0921    Hgb A1C No results found for: HGBA1C     Assessment and Plan:  Assessment and Plan    Cystic acne of jaw and chin Persistent cystic acne on jaw and chin, unresponsive to previous treatments. - Prescribed spironolactone 25 mg daily. - Referred to dermatologist for further evaluation and management.   RTC in 2 months for followup chronic conditions Follow Up Instructions:    I discussed the assessment and treatment plan with the patient. The patient was provided an opportunity to ask questions and all were answered. The patient agreed with the plan and demonstrated an understanding of the instructions.   The patient was advised to call back or seek an in-person evaluation if the symptoms worsen or if the condition fails to improve as anticipated.   Angeline Laura, NP

## 2024-09-10 ENCOUNTER — Encounter: Payer: Self-pay | Admitting: Internal Medicine

## 2024-09-10 ENCOUNTER — Telehealth: Admitting: Internal Medicine

## 2024-09-10 DIAGNOSIS — B9689 Other specified bacterial agents as the cause of diseases classified elsewhere: Secondary | ICD-10-CM

## 2024-09-10 DIAGNOSIS — J0100 Acute maxillary sinusitis, unspecified: Secondary | ICD-10-CM

## 2024-09-10 DIAGNOSIS — J3089 Other allergic rhinitis: Secondary | ICD-10-CM | POA: Diagnosis not present

## 2024-09-10 DIAGNOSIS — J019 Acute sinusitis, unspecified: Secondary | ICD-10-CM | POA: Diagnosis not present

## 2024-09-10 MED ORDER — PREDNISONE 10 MG PO TABS: ORAL_TABLET | ORAL | 0 refills | Status: AC

## 2024-09-10 MED ORDER — CEFDINIR 300 MG PO CAPS: 300.0000 mg | ORAL_CAPSULE | Freq: Two times a day (BID) | ORAL | 0 refills | Status: AC

## 2024-09-10 NOTE — Progress Notes (Signed)
 Virtual Visit via Video Note  I connected with Emma Gaines on 09/10/24 at  4:00 PM EDT by a video enabled telemedicine application and verified that I am speaking with the correct person using two identifiers.  Location: Patient: Work Provider: Engineer, structural in this video call: Angeline Laura, NP-C and Zahira Brummond   I discussed the limitations of evaluation and management by telemedicine and the availability of in person appointments. The patient expressed understanding and agreed to proceed.  History of Present Illness:  Discussed the use of AI scribe software for clinical note transcription with the patient, who gave verbal consent to proceed.  Emma Gaines is a 23 year old female who presents with symptoms of an sinus infection.  She initially experienced a sore throat, followed by body aches, both of which have since resolved. She currently has congestion, rhinorrhea, and is expectorating bright green sputum. No fever or chills. She has a headache, which is not severe, and experiences facial pain in the mornings until she clears the mucus.  She has taken Sudafed, allergy  medications, DayQuil, and NyQuil for her symptoms. She has a history of similar symptoms occurring annually, which previously required antibiotics and steroids for resolution. She is not aware of any allergies except for corn dogs. This pattern of symptoms occurs every year.        Past Medical History:  Diagnosis Date   Frequent headaches     Current Outpatient Medications  Medication Sig Dispense Refill   albuterol  (VENTOLIN  HFA) 108 (90 Base) MCG/ACT inhaler Inhale 2 puffs into the lungs every 6 (six) hours as needed for wheezing or shortness of breath. 1 each 0   EPINEPHrine  0.3 mg/0.3 mL IJ SOAJ injection Inject 0.3 mg into the muscle as needed for anaphylaxis.     fluticasone  (FLONASE ) 50 MCG/ACT nasal spray Place 2 sprays into both nostrils daily. 16 g 6    levocetirizine (XYZAL  ALLERGY  24HR) 5 MG tablet Take 1 tablet (5 mg total) by mouth daily. 30 tablet 0   spironolactone (ALDACTONE) 25 MG tablet Take 1 tablet (25 mg total) by mouth daily. 90 tablet 1   No current facility-administered medications for this visit.    Allergies  Allergen Reactions   Other Anaphylaxis    CORN DOG    Family History  Problem Relation Age of Onset   Healthy Mother    Healthy Father    Healthy Brother    Lung cancer Maternal Grandmother    Alcohol abuse Maternal Grandfather    Breast cancer Paternal Aunt    Colon cancer Paternal Uncle 37    Social History   Socioeconomic History   Marital status: Single    Spouse name: Not on file   Number of children: Not on file   Years of education: Not on file   Highest education level: Not on file  Occupational History   Not on file  Tobacco Use   Smoking status: Never   Smokeless tobacco: Never  Vaping Use   Vaping status: Never Used  Substance and Sexual Activity   Alcohol use: Yes    Alcohol/week: 0.0 standard drinks of alcohol    Comment: occasional    Drug use: No   Sexual activity: Yes  Other Topics Concern   Not on file  Social History Narrative   Not on file   Social Drivers of Health   Financial Resource Strain: Not on file  Food Insecurity: Not on file  Transportation Needs: Not  on file  Physical Activity: Not on file  Stress: Not on file  Social Connections: Not on file  Intimate Partner Violence: Not on file     Constitutional: Pt reports headache. Denies fever, malaise, fatigue, or abrupt weight changes.  HEENT: Pt reports sinus pressure, runny nose, nasal congestion and sore throat. Denies eye pain, eye redness, ear pain, ringing in the ears, wax buildup, runny nose, nasal congestion, bloody nose. Respiratory: Pt reports cough. Denies difficulty breathing, shortness of breath.   Cardiovascular: Denies chest pain, chest tightness, palpitations or swelling in the hands or  feet.  Gastrointestinal: Denies abdominal pain, bloating, constipation, diarrhea or blood in the stool Musculoskeletal: Pt reports body aches. Denies decrease in range of motion, difficulty with gait, or joint swelling.  Skin: Denies redness, rashes, lesions or ulcercations.  Neurological: Denies dizziness, difficulty with memory, difficulty with speech or problems with balance and coordination.    No other specific complaints in a complete review of systems (except as listed in HPI above).  Observations/Objective:  Wt Readings from Last 3 Encounters:  05/24/24 157 lb (71.2 kg)  05/21/24 154 lb 3.2 oz (69.9 kg)  12/05/23 156 lb 3.2 oz (70.9 kg)    General: Appears her stated age, appears unwell but in NAD. HEENT: Head: normal shape and size, maxillary sinus tenderness reported; Eyes: sclera white, no icterus, conjunctiva pink, PERRLA and EOMs intact; Nose: congestion noted; Throat/Mouth: hoarseness noted.  Pulmonary/Chest: Normal effort. No respiratory distress.  Neurological: Alert and oriented.    BMET    Component Value Date/Time   NA 141 03/14/2022 0921   K 4.3 03/14/2022 0921   CL 104 03/14/2022 0921   CO2 28 03/14/2022 0921   GLUCOSE 95 03/14/2022 0921   BUN 14 03/14/2022 0921   CREATININE 0.67 03/14/2022 0921   CALCIUM 9.7 03/14/2022 0921    Lipid Panel     Component Value Date/Time   CHOL 168 03/14/2022 0921   TRIG 63 03/14/2022 0921   HDL 64 03/14/2022 0921   CHOLHDL 2.6 03/14/2022 0921   VLDL 37.4 10/10/2018 1536   LDLCALC 89 03/14/2022 0921    CBC    Component Value Date/Time   WBC 6.2 03/14/2022 0921   RBC 4.66 03/14/2022 0921   HGB 13.9 03/14/2022 0921   HCT 42.1 03/14/2022 0921   PLT 267 03/14/2022 0921   MCV 90.3 03/14/2022 0921   MCH 29.8 03/14/2022 0921   MCHC 33.0 03/14/2022 0921   RDW 12.1 03/14/2022 0921    Hgb A1C No results found for: HGBA1C     Assessment and Plan:  Assessment and Plan    Acute sinusitis likely triggered  by allergic rhinitis Acute sinusitis with congestion, rhinorrhea, productive cough, headache, sore throat, and myalgia. Symptoms persistent with morning facial pain. Allergy -induced due to weather changes. Augmentin  declined due to gastrointestinal side effects. Omnicef chosen as alternative. - Prescribed Omnicef (cefdinir) 300 mg twice daily for 10 days. - Prescribed 6-day steroid taper. - Recommend continuation of antihistamine and steroid nasal spray for 2 to 3 weeks as the seasons change      RTC in 2 months, followup chronic conditions  Follow Up Instructions:    I discussed the assessment and treatment plan with the patient. The patient was provided an opportunity to ask questions and all were answered. The patient agreed with the plan and demonstrated an understanding of the instructions.   The patient was advised to call back or seek an in-person evaluation if the symptoms worsen  or if the condition fails to improve as anticipated.   Angeline Laura, NP

## 2024-09-10 NOTE — Patient Instructions (Signed)

## 2024-09-10 NOTE — Telephone Encounter (Signed)
 Yes, we did not discuss.  I can do it at 1 PM

## 2025-01-10 ENCOUNTER — Telehealth

## 2025-01-10 DIAGNOSIS — L089 Local infection of the skin and subcutaneous tissue, unspecified: Secondary | ICD-10-CM

## 2025-01-10 DIAGNOSIS — S01331A Puncture wound without foreign body of right ear, initial encounter: Secondary | ICD-10-CM

## 2025-01-10 MED ORDER — MUPIROCIN 2 % EX OINT: 1.0000 | TOPICAL_OINTMENT | Freq: Two times a day (BID) | CUTANEOUS | 0 refills | Status: AC

## 2025-01-10 MED ORDER — CIPROFLOXACIN HCL 750 MG PO TABS: 750.0000 mg | ORAL_TABLET | Freq: Two times a day (BID) | ORAL | 0 refills | Status: AC

## 2025-01-10 NOTE — Progress Notes (Signed)
 " Virtual Visit Consent   [name], you are scheduled for a virtual visit with a [facility] provider today. Just as with appointments in the office, your consent must be obtained to participate. Your consent will be active for this visit and any virtual visit you may have with one of our providers in the next 365 days. If you have a MyChart account, a copy of this consent can be sent to you electronically.  As this is a virtual visit, video technology does not allow for your provider to perform a traditional examination. This may limit your provider's ability to fully assess your condition. If your provider identifies any concerns that need to be evaluated in person or the need to arrange testing (such as labs, EKG, etc.), we will make arrangements to do so. Although advances in technology are sophisticated, we cannot ensure that it will always work on either your end or our end. If the connection with a video visit is poor, the visit may have to be switched to a telephone visit. With either a video or telephone visit, we are not always able to ensure that we have a secure connection.  By engaging in this virtual visit, you consent to the provision of healthcare and authorize for your insurance to be billed (if applicable) for the services provided during this visit. Depending on your insurance coverage, you may receive a charge related to this service.  I need to obtain your verbal consent now. Are you willing to proceed with your visit today? Larisha Soley Harriss has provided verbal consent on 01/10/2025 for a virtual visit (video or telephone). Jon CHRISTELLA Belt, NP  Date: 01/10/2025 12:42 PM   Virtual Visit via Video Note   I, Jon CHRISTELLA Belt, connected with  Marranda Arakelian  ([id number], [birth year]) on 01/10/25 at 12:30 PM EST by a video-enabled telemedicine application and verified that I am speaking with the correct person using two identifiers.  Location: Patient: Virtual  Visit Location Patient: Other: work Provider: Pharmacist, Community: Home Office   I discussed the limitations of evaluation and management by telemedicine and the availability of in person appointments. The patient expressed understanding and agreed to proceed.    History of Present Illness: Emma Gaines is a 24 y.o. who identifies as a female who was assigned female at birth, and is being seen today for infected ear piercing.   R ear piercing from 3 months ago is hot, swollen, painful since yesterday, had a small amount of pus drain yesterday. Was fine before yesterday. No pain in mastoid area or preauricular area, no pain internal ear canal.No fever.   HPI: HPI  Problems:  Patient Active Problem List   Diagnosis Date Noted   Overweight with body mass index (BMI) of 26 to 26.9 in adult 03/14/2022   Anxiety and depression 09/21/2017   Frequent headaches 05/19/2015    Allergies: Allergies[1] Medications: Current Medications[2]  Observations/Objective: Patient is well-developed, well-nourished in no acute distress.  Resting comfortably at work.  Head is normocephalic, atraumatic.  No labored breathing.  Speech is clear and coherent with logical content.  Patient is alert and oriented at baseline.    Assessment and Plan: 1. Pierced ear infection, right, initial encounter (Primary) - ciprofloxacin  (CIPRO ) 750 MG tablet; Take 1 tablet (750 mg total) by mouth 2 (two) times daily for 5 days.  Dispense: 10 tablet; Refill: 0 - mupirocin  ointment (BACTROBAN ) 2 %; Apply 1 Application topically 2 (two) times daily.  Dispense: 22 g; Refill: 0  Will treat for p. Aeruginosa and staph. She will remove piercing and clean it thoroughly with alcohol before replacing. Should clean it daily.   If not improving or if worsening, please be seen in person for help.   Follow Up Instructions: I discussed the assessment and treatment plan with the patient. The patient was provided an  opportunity to ask questions and all were answered. The patient agreed with the plan and demonstrated an understanding of the instructions.  A copy of instructions were sent to the patient via MyChart unless otherwise noted below.   The patient was advised to call back or seek an in-person evaluation if the symptoms worsen or if the condition fails to improve as anticipated.    Jon CHRISTELLA Belt, NP    [1]  Allergies Allergen Reactions   Other Anaphylaxis    CORN DOG  [2]  Current Outpatient Medications:    ciprofloxacin  (CIPRO ) 750 MG tablet, Take 1 tablet (750 mg total) by mouth 2 (two) times daily for 5 days., Disp: 10 tablet, Rfl: 0   mupirocin  ointment (BACTROBAN ) 2 %, Apply 1 Application topically 2 (two) times daily., Disp: 22 g, Rfl: 0   albuterol  (VENTOLIN  HFA) 108 (90 Base) MCG/ACT inhaler, Inhale 2 puffs into the lungs every 6 (six) hours as needed for wheezing or shortness of breath., Disp: 1 each, Rfl: 0   cefdinir  (OMNICEF ) 300 MG capsule, Take 1 capsule (300 mg total) by mouth 2 (two) times daily., Disp: 20 capsule, Rfl: 0   EPINEPHrine  0.3 mg/0.3 mL IJ SOAJ injection, Inject 0.3 mg into the muscle as needed for anaphylaxis., Disp: , Rfl:    fluticasone  (FLONASE ) 50 MCG/ACT nasal spray, Place 2 sprays into both nostrils daily., Disp: 16 g, Rfl: 6   levocetirizine (XYZAL  ALLERGY  24HR) 5 MG tablet, Take 1 tablet (5 mg total) by mouth daily., Disp: 30 tablet, Rfl: 0   predniSONE  (DELTASONE ) 10 MG tablet, Take 6 tabs on day 1, 5 tabs on day 2, 4 tabs on day 3, 3 tabs on day 4, 2 tabs on day 5, 1 tab on day 6, Disp: 21 tablet, Rfl: 0   spironolactone  (ALDACTONE ) 25 MG tablet, Take 1 tablet (25 mg total) by mouth daily., Disp: 90 tablet, Rfl: 1  "

## 2025-01-10 NOTE — Patient Instructions (Signed)
 " [name], thank you for joining Jon CHRISTELLA Belt, NP for today's virtual visit.  While this provider is not your primary care provider (PCP), if your PCP is located in our provider database this encounter information will be shared with them immediately following your visit.   A [facility] MyChart account gives you access to today's visit and all your visits, tests, and labs performed at [facility]  click here if you don't have a [facility] MyChart account or go to [email or web address]  Consent: (Patient) Emma Gaines provided verbal consent for this virtual visit at the beginning of the encounter.  Current Medications:  Current Outpatient Medications:    ciprofloxacin  (CIPRO ) 750 MG tablet, Take 1 tablet (750 mg total) by mouth 2 (two) times daily for 5 days., Disp: 10 tablet, Rfl: 0   mupirocin  ointment (BACTROBAN ) 2 %, Apply 1 Application topically 2 (two) times daily., Disp: 22 g, Rfl: 0   albuterol  (VENTOLIN  HFA) 108 (90 Base) MCG/ACT inhaler, Inhale 2 puffs into the lungs every 6 (six) hours as needed for wheezing or shortness of breath., Disp: 1 each, Rfl: 0   cefdinir  (OMNICEF ) 300 MG capsule, Take 1 capsule (300 mg total) by mouth 2 (two) times daily., Disp: 20 capsule, Rfl: 0   EPINEPHrine  0.3 mg/0.3 mL IJ SOAJ injection, Inject 0.3 mg into the muscle as needed for anaphylaxis., Disp: , Rfl:    fluticasone  (FLONASE ) 50 MCG/ACT nasal spray, Place 2 sprays into both nostrils daily., Disp: 16 g, Rfl: 6   levocetirizine (XYZAL  ALLERGY  24HR) 5 MG tablet, Take 1 tablet (5 mg total) by mouth daily., Disp: 30 tablet, Rfl: 0   predniSONE  (DELTASONE ) 10 MG tablet, Take 6 tabs on day 1, 5 tabs on day 2, 4 tabs on day 3, 3 tabs on day 4, 2 tabs on day 5, 1 tab on day 6, Disp: 21 tablet, Rfl: 0   spironolactone  (ALDACTONE ) 25 MG tablet, Take 1 tablet (25 mg total) by mouth daily., Disp: 90 tablet, Rfl: 1   Medications ordered in this encounter:  Meds  ordered this encounter  Medications   ciprofloxacin  (CIPRO ) 750 MG tablet    Sig: Take 1 tablet (750 mg total) by mouth 2 (two) times daily for 5 days.    Dispense:  10 tablet    Refill:  0   mupirocin  ointment (BACTROBAN ) 2 %    Sig: Apply 1 Application topically 2 (two) times daily.    Dispense:  22 g    Refill:  0     *If you need refills on other medications prior to your next appointment, please contact your pharmacy*  Follow-Up: Call back or seek an in-person evaluation if the symptoms worsen or if the condition fails to improve as anticipated.     Other Instructions  Remove piercing and clean it thoroughly with alcohol before replacing. You should clean it daily.   If not improving or if worsening, please be seen in person for help.    If you have been instructed to have an in-person evaluation today at a local Urgent Care facility, please use the link below. It will take you to a list of all of our available [facility], including address, phone number and hours of operation. Please do not delay care.   Urgent Cares  If you or a family member do not have a primary care provider, use the link below to schedule a visit and establish care. When you  choose a [facility] primary care physician or advanced practice provider, you gain a long-term partner in health. Find a Primary Care Provider  Learn more about [facility]'s in-office and virtual care options: [facility] - Get Care Now  "
